# Patient Record
Sex: Male | Born: 1990 | Race: White | Hispanic: No | Marital: Single | Smoking: Former smoker
Health system: Southern US, Community
[De-identification: ages and names within clinical notes are randomized; demographics above are authoritative.]

## PROBLEM LIST (undated history)

## (undated) DIAGNOSIS — F419 Anxiety disorder, unspecified: Secondary | ICD-10-CM

## (undated) DIAGNOSIS — K219 Gastro-esophageal reflux disease without esophagitis: Secondary | ICD-10-CM

## (undated) DIAGNOSIS — F429 Obsessive-compulsive disorder, unspecified: Secondary | ICD-10-CM

## (undated) DIAGNOSIS — F1121 Opioid dependence, in remission: Secondary | ICD-10-CM

## (undated) DIAGNOSIS — F1123 Opioid dependence with withdrawal: Secondary | ICD-10-CM

## (undated) DIAGNOSIS — R0789 Other chest pain: Secondary | ICD-10-CM

## (undated) HISTORY — DX: Other chest pain: R07.89

## (undated) HISTORY — DX: Anxiety disorder, unspecified: F41.9

## (undated) HISTORY — DX: Opioid dependence with withdrawal: F11.23

## (undated) HISTORY — DX: Opioid dependence, in remission: F11.21

## (undated) HISTORY — DX: Gastro-esophageal reflux disease without esophagitis: K21.9

## (undated) HISTORY — DX: Obsessive-compulsive disorder, unspecified: F42.9

---
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
## 2010-08-07 ENCOUNTER — Ambulatory Visit: Payer: Self-pay | Admitting: Nephrology

## 2010-08-24 ENCOUNTER — Encounter: Payer: Self-pay | Admitting: Cardiovascular Disease

## 2011-01-20 ENCOUNTER — Emergency Department: Payer: Self-pay

## 2012-02-07 IMAGING — CR DG CHEST 2V
1 series · 2 of 2 positions shown · non-contrast
Comparison: none

REASON FOR EXAM: wheezing
COMMENTS:

PROCEDURE:     DXR - DXR CHEST PA (OR AP) AND LATERAL  - August 07, 2010  [DATE]
RESULT:     Comparison: None

[Series 1: view not recorded · 0.17mm/px · 2 of 2 slices shown]
[im 1/2]
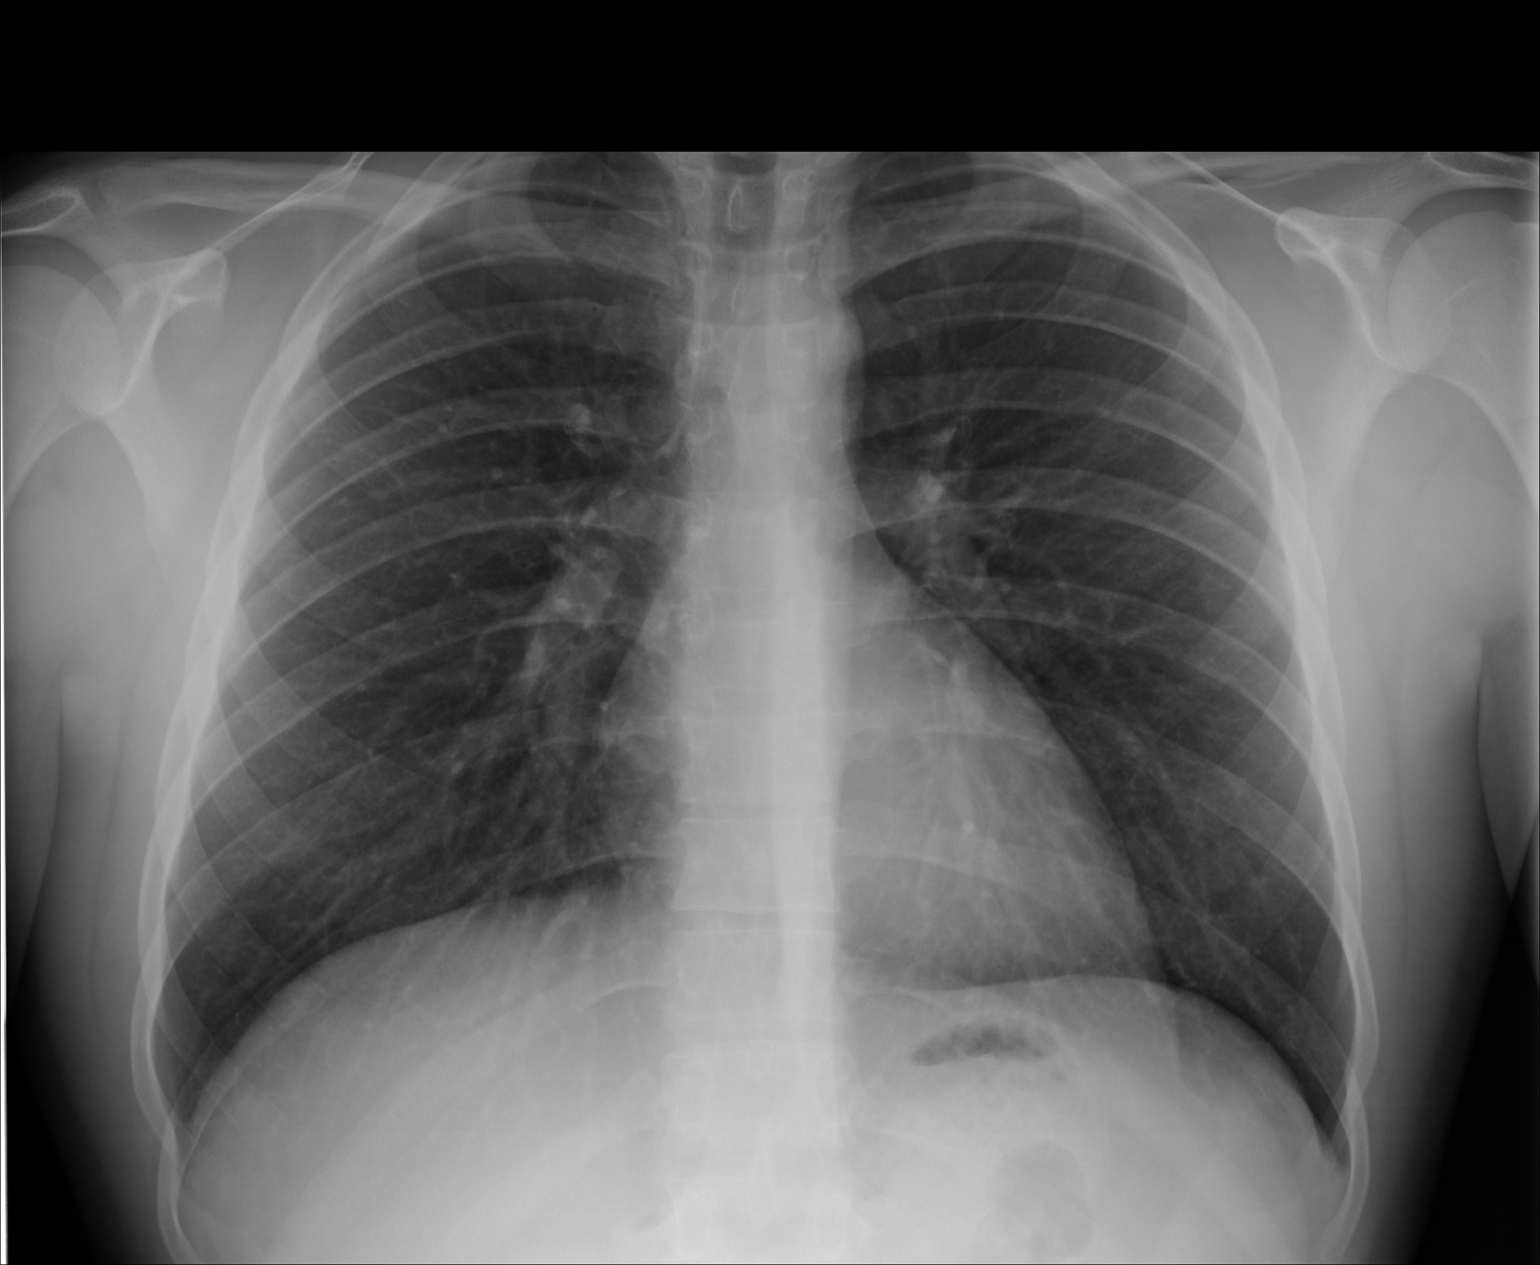
[im 2/2]
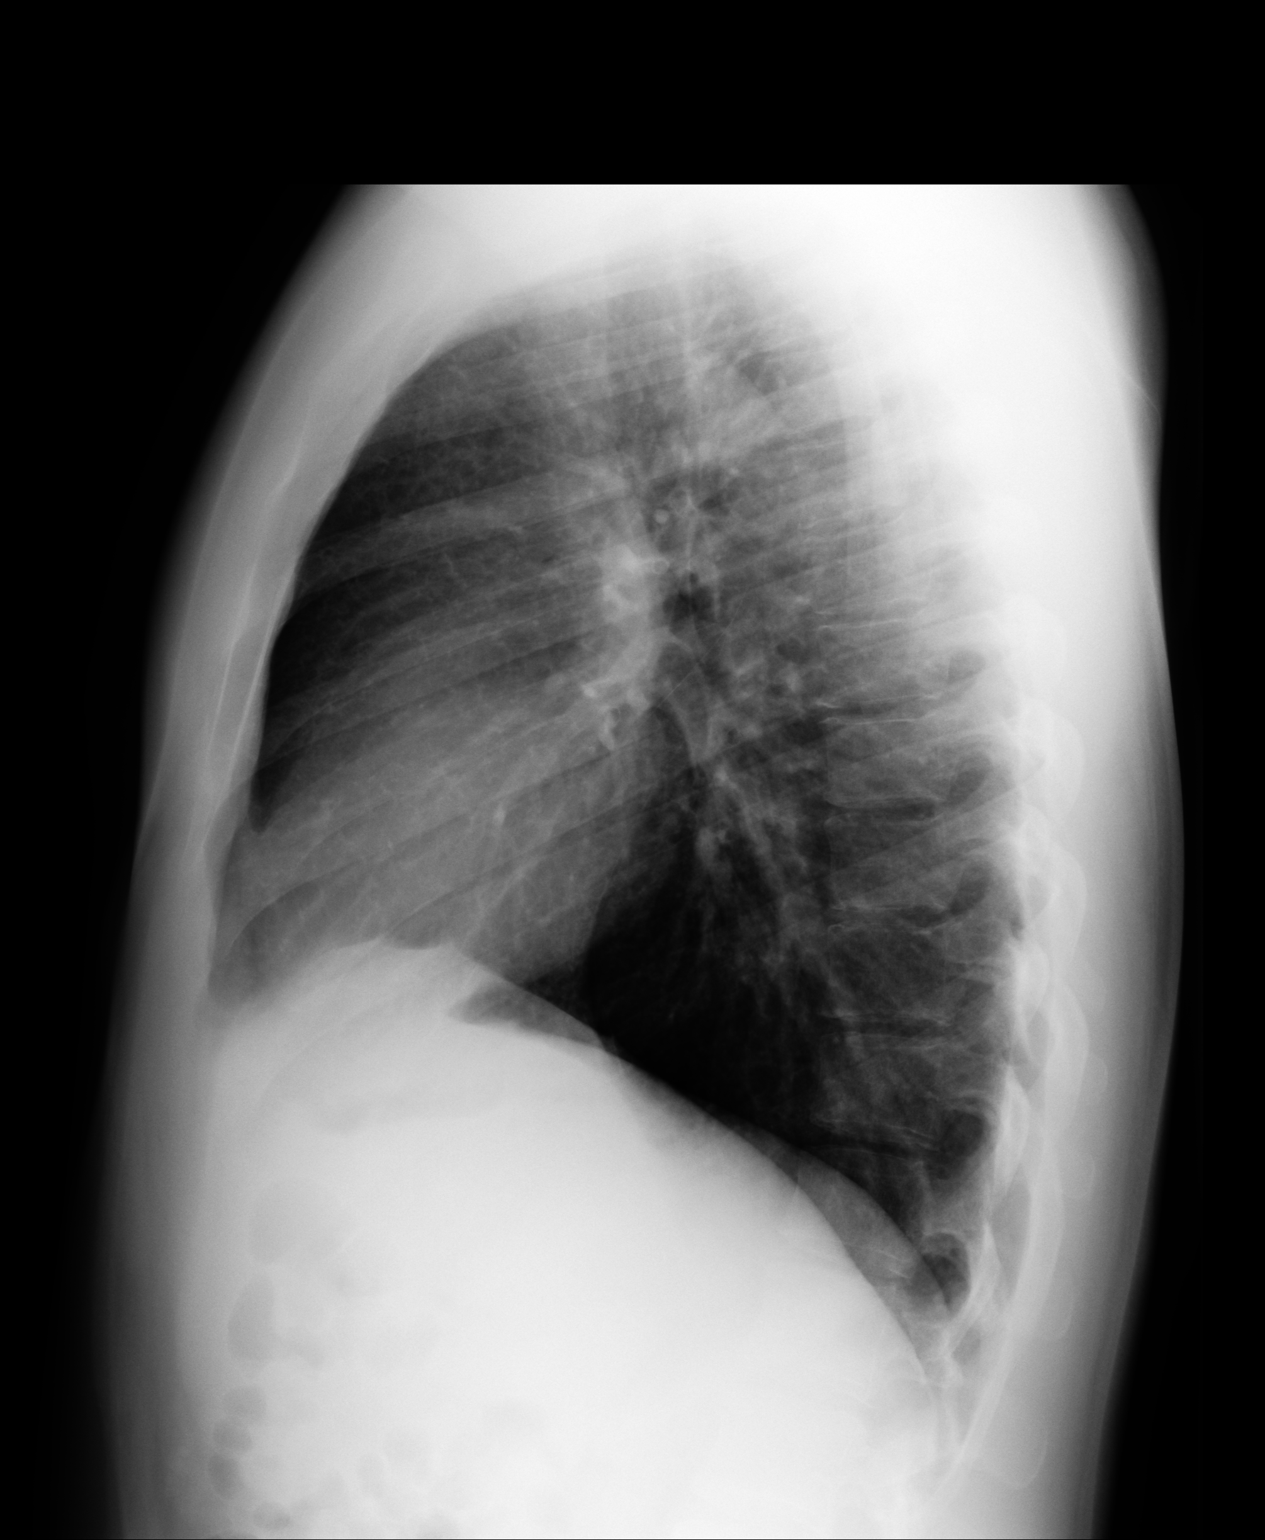

[2 of 2 positions shown; findings below may reference images not displayed]

FINDINGS: PA and lateral chest radiographs are provided.  There is no focal
parenchymal opacity, pleural effusion, or pneumothorax. The heart and
mediastinum are unremarkable.  The osseous structures are unremarkable.
IMPRESSION: No acute disease of the chest.

## 2014-03-05 ENCOUNTER — Ambulatory Visit: Payer: Self-pay | Admitting: Emergency Medicine

## 2014-03-11 ENCOUNTER — Encounter (INDEPENDENT_AMBULATORY_CARE_PROVIDER_SITE_OTHER): Payer: Self-pay

## 2014-03-11 ENCOUNTER — Encounter: Payer: Self-pay | Admitting: Internal Medicine

## 2014-03-11 ENCOUNTER — Ambulatory Visit (INDEPENDENT_AMBULATORY_CARE_PROVIDER_SITE_OTHER): Payer: BLUE CROSS/BLUE SHIELD | Admitting: *Deleted

## 2014-03-11 ENCOUNTER — Ambulatory Visit (INDEPENDENT_AMBULATORY_CARE_PROVIDER_SITE_OTHER): Payer: BLUE CROSS/BLUE SHIELD | Admitting: Internal Medicine

## 2014-03-11 VITALS — BP 117/79 | HR 90 | Temp 98.1°F | Ht 68.75 in | Wt 205.5 lb

## 2014-03-11 DIAGNOSIS — R0602 Shortness of breath: Secondary | ICD-10-CM | POA: Insufficient documentation

## 2014-03-11 DIAGNOSIS — F411 Generalized anxiety disorder: Secondary | ICD-10-CM

## 2014-03-11 DIAGNOSIS — R229 Localized swelling, mass and lump, unspecified: Secondary | ICD-10-CM

## 2014-03-11 DIAGNOSIS — Z0000 Encounter for general adult medical examination without abnormal findings: Secondary | ICD-10-CM

## 2014-03-11 DIAGNOSIS — Z23 Encounter for immunization: Secondary | ICD-10-CM

## 2014-03-11 DIAGNOSIS — R790 Abnormal level of blood mineral: Secondary | ICD-10-CM

## 2014-03-11 LAB — CBC WITH DIFFERENTIAL/PLATELET
Basophils Absolute: 0 10*3/uL (ref 0.0–0.1)
Basophils Relative: 0.4 % (ref 0.0–3.0)
Eosinophils Absolute: 0.5 10*3/uL (ref 0.0–0.7)
Eosinophils Relative: 7.2 % — ABNORMAL HIGH (ref 0.0–5.0)
HCT: 47.1 % (ref 39.0–52.0)
Hemoglobin: 16 g/dL (ref 13.0–17.0)
Lymphocytes Relative: 25 % (ref 12.0–46.0)
Lymphs Abs: 1.7 10*3/uL (ref 0.7–4.0)
MCHC: 34 g/dL (ref 30.0–36.0)
MCV: 85.3 fl (ref 78.0–100.0)
Monocytes Absolute: 0.6 10*3/uL (ref 0.1–1.0)
Monocytes Relative: 8.5 % (ref 3.0–12.0)
Neutro Abs: 4 10*3/uL (ref 1.4–7.7)
Neutrophils Relative %: 58.9 % (ref 43.0–77.0)
Platelets: 208 10*3/uL (ref 150.0–400.0)
RBC: 5.52 Mil/uL (ref 4.22–5.81)
RDW: 12.9 % (ref 11.5–15.5)
WBC: 6.7 10*3/uL (ref 4.0–10.5)

## 2014-03-11 LAB — LIPID PANEL
Cholesterol: 207 mg/dL — ABNORMAL HIGH (ref 0–200)
HDL: 47.3 mg/dL (ref >39.00)
NonHDL: 159.7
Total CHOL/HDL Ratio: 4
Triglycerides: 233 mg/dL — ABNORMAL HIGH (ref 0.0–149.0)
VLDL: 46.6 mg/dL — ABNORMAL HIGH (ref 0.0–40.0)

## 2014-03-11 LAB — COMPREHENSIVE METABOLIC PANEL
ALT: 34 U/L (ref 0–53)
AST: 23 U/L (ref 0–37)
Albumin: 4.7 g/dL (ref 3.5–5.2)
Alkaline Phosphatase: 46 U/L (ref 39–117)
BUN: 14 mg/dL (ref 6–23)
CO2: 27 mEq/L (ref 19–32)
Calcium: 9.7 mg/dL (ref 8.4–10.5)
Chloride: 104 mEq/L (ref 96–112)
Creatinine, Ser: 0.8 mg/dL (ref 0.4–1.5)
GFR: 130.12 mL/min (ref >60.00)
Glucose, Bld: 93 mg/dL (ref 70–99)
Potassium: 4.5 mEq/L (ref 3.5–5.1)
Sodium: 140 mEq/L (ref 135–145)
Total Bilirubin: 0.7 mg/dL (ref 0.2–1.2)
Total Protein: 7.1 g/dL (ref 6.0–8.3)

## 2014-03-11 LAB — TSH: TSH: 1.27 u[IU]/mL (ref 0.35–4.50)

## 2014-03-11 LAB — VITAMIN D 25 HYDROXY (VIT D DEFICIENCY, FRACTURES): VITD: 18.04 ng/mL — ABNORMAL LOW (ref 30.00–100.00)

## 2014-03-11 LAB — LDL CHOLESTEROL, DIRECT: Direct LDL: 127.9 mg/dL

## 2014-03-11 MED ORDER — CLONAZEPAM 0.5 MG PO TABS: 0.5000 mg | ORAL_TABLET | Freq: Two times a day (BID) | ORAL | Status: DC | PRN

## 2014-03-11 MED ORDER — ESCITALOPRAM OXALATE 5 MG PO TABS: 5.0000 mg | ORAL_TABLET | Freq: Every day | ORAL | Status: DC

## 2014-03-11 NOTE — Assessment & Plan Note (Signed)
Likely related to panic attacks during anxiety. EKG normal today. Will treat anxiety as noted. Follow up 4 weeks.

## 2014-03-11 NOTE — Patient Instructions (Signed)
Start Lexapro  daily.  Take Clonazepam 0.5mg  up to twice daily for severe anxiety. Note that Clonazepam can cause drowsiness.  Labs today.  Follow up in 4 weeks.

## 2014-03-11 NOTE — Progress Notes (Signed)
Subjective:    Patient ID: Matthew Curry, male    DOB: 1990-07-06, 24 y.o.   MRN: [id number]  HPI 24YO male presents to establish care.  Anxiety - Went to urgent care 1 week ago for anxiety. Given Atarax, but never took this. At times feels chest tightness and shortness of breath with anxiety.  Work makes anxiety worse. Feels anxious most of the time.  Describes self as "worrier." As child, had to wash hands repeatedly. Now, sometimes repeatedly checks locks. Has never been on meds for this.  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 Has tapered to 1/16th of a patch daily. Plans to stop.   Past medical, surgical, family and social history per today's encounter.  Review of Systems  Constitutional: Negative for fever, chills, activity change, appetite change, fatigue and unexpected weight change.  Eyes: Negative for visual disturbance.  Respiratory: Negative for cough and shortness of breath.   Cardiovascular: Negative for chest pain, palpitations and leg swelling.  Gastrointestinal: Negative for nausea, vomiting, abdominal pain, diarrhea, constipation and abdominal distention.  Genitourinary: Negative for dysuria, urgency and difficulty urinating.  Musculoskeletal: Negative for myalgias, arthralgias and gait problem.  Skin: Negative for color change and rash.  Hematological: Negative for adenopathy.  Psychiatric/Behavioral: Negative for suicidal ideas, sleep disturbance and dysphoric mood. The patient is not nervous/anxious.        Objective:    BP 117/79 mmHg  Pulse 90  Temp(Src) 98.1 F (36.7 C) (Oral)  Ht 5' 8.75" (1.746 m)  Wt 205 lb 8 oz (93.214 kg)  BMI 30.58 kg/m2  SpO2 97% Physical Exam  Constitutional: He is oriented to person, place, and time. He appears well-developed and well-nourished. No distress.  HENT:  Head: Normocephalic and atraumatic.  Right Ear: External ear normal.  Left Ear: External ear  normal.  Nose: Nose normal.  Mouth/Throat: Oropharynx is clear and moist. No oropharyngeal exudate.  Eyes: Conjunctivae and EOM are normal. Pupils are equal, round, and reactive to light. Right eye exhibits no discharge. Left eye exhibits no discharge. No scleral icterus.  Neck: Normal range of motion. Neck supple. No tracheal deviation present. No thyromegaly present.  Cardiovascular: Normal rate, regular rhythm and normal heart sounds.  Exam reveals no gallop and no friction rub.   No murmur heard. Pulmonary/Chest: Effort normal and breath sounds normal. No accessory muscle usage. No tachypnea. No respiratory distress. He has no decreased breath sounds. He has no wheezes. He has no rhonchi. He has no rales. He exhibits no tenderness.  Abdominal: Soft. Bowel sounds are normal. He exhibits no distension and no mass. There is no tenderness. There is no rebound and no guarding.  Musculoskeletal: Normal range of motion. He exhibits no edema.  Lymphadenopathy:    He has no cervical adenopathy.  Neurological: He is alert and oriented to person, place, and time. No cranial nerve deficit. Coordination normal.  Skin: Skin is warm and dry. No rash noted. He is not diaphoretic. No erythema. No pallor.     Psychiatric: He has a normal mood and affect. His behavior is normal. Judgment and thought content normal.          Assessment & Plan:   Problem List Items Addressed This Visit      Unprioritized   Generalized anxiety disorder - Primary    Symptoms most consistent with GAD. Will start Lexapro and prn Clonazepam. Discussed referral to psychiatry, but he prefers to hold off  on this. Follow up in 4 weeks and prn.    Relevant Medications      escitalopram (LEXAPRO) tablet      clonazePAM (KLONOPIN)  tablet   Other Relevant Orders      TSH   Shortness of breath    Likely related to panic attacks during anxiety. EKG normal today. Will treat anxiety as noted. Follow up 4 weeks.    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 Given this has been unchanged for >2 years, likely benign. Discussed option of monitoring for now versus referral for resection. Will monitor.     Other Visit Diagnoses    Routine general medical examination at a health care facility        Relevant Orders       CBC with Differential       Comprehensive metabolic panel       Lipid panel       Vit D  25 hydroxy (rtn osteoporosis monitoring)        Return in about 4 weeks (around 04/08/2014) for Recheck.

## 2014-03-11 NOTE — Assessment & Plan Note (Signed)
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 Given this has been unchanged for >2 years, likely benign. Discussed option of monitoring for now versus referral for resection. Will monitor.

## 2014-03-11 NOTE — Assessment & Plan Note (Signed)
Symptoms most consistent with GAD. Will start Lexapro and prn Clonazepam. Discussed referral to psychiatry, but he prefers to hold off on this. Follow up in 4 weeks and prn.

## 2014-04-09 ENCOUNTER — Ambulatory Visit: Payer: BLUE CROSS/BLUE SHIELD | Admitting: Internal Medicine

## 2014-05-01 ENCOUNTER — Ambulatory Visit (INDEPENDENT_AMBULATORY_CARE_PROVIDER_SITE_OTHER): Payer: BLUE CROSS/BLUE SHIELD | Admitting: Internal Medicine

## 2014-05-01 ENCOUNTER — Encounter: Payer: Self-pay | Admitting: Internal Medicine

## 2014-05-01 VITALS — BP 135/77 | HR 70 | Temp 98.0°F | Ht 68.75 in | Wt 210.0 lb

## 2014-05-01 DIAGNOSIS — F411 Generalized anxiety disorder: Secondary | ICD-10-CM

## 2014-05-01 NOTE — Assessment & Plan Note (Signed)
Symptoms of GAD unchanged. Discussed risks and benefits of Lexapro. Will restart medication. Continue prn Clonazepam. Encouraged relaxation techniques, exercise. Follow up by email in 2 weeks and in visit in 4 weeks.

## 2014-05-01 NOTE — Progress Notes (Signed)
Pre visit review using our clinic review tool, if applicable. No additional management support is needed unless otherwise documented below in the visit note. 

## 2014-05-01 NOTE — Patient Instructions (Signed)
Email with update in 2-3 weeks.

## 2014-05-01 NOTE — Progress Notes (Signed)
   Subjective:    Patient ID: Matthew Curry, male    DOB: 1990/11/14, 24 y.o.   MRN: [id number]  HPI 24YO male presents for follow up.  Last seen 1/4 for GAD. Started on Lexapro and prn Clonazepam. Took Lexapro for about 1 week, then stopped. Worried about side effects. Used Clonazepam about 10 times last month. Continues to have daily anxiety. Trying to use relaxation techniques and increase exercise. Notes that work is overwhelming at times.   Past medical, surgical, family and social history per today's encounter.  Review of Systems  Constitutional: Negative for fever, chills, activity change, appetite change, fatigue and unexpected weight change.  Eyes: Negative for visual disturbance.  Respiratory: Negative for cough and shortness of breath.   Cardiovascular: Negative for chest pain, palpitations and leg swelling.  Gastrointestinal: Negative for abdominal pain and abdominal distention.  Genitourinary: Negative for dysuria, urgency and difficulty urinating.  Musculoskeletal: Negative for arthralgias and gait problem.  Skin: Negative for color change and rash.  Hematological: Negative for adenopathy.  Psychiatric/Behavioral: Negative for sleep disturbance and dysphoric mood. The patient is nervous/anxious.        Objective:    BP 135/77 mmHg  Pulse 70  Temp(Src) 98 F (36.7 C) (Oral)  Ht 5' 8.75" (1.746 m)  Wt 210 lb (95.255 kg)  BMI 31.25 kg/m2  SpO2 98% Physical Exam  Constitutional: He is oriented to person, place, and time. He appears well-developed and well-nourished. No distress.  HENT:  Head: Normocephalic and atraumatic.  Right Ear: External ear normal.  Left Ear: External ear normal.  Nose: Nose normal.  Mouth/Throat: Oropharynx is clear and moist. No oropharyngeal exudate.  Eyes: Conjunctivae and EOM are normal. Pupils are equal, round, and reactive to light. Right eye exhibits no discharge. Left eye exhibits no discharge. No scleral icterus.    Neck: Normal range of motion. Neck supple. No tracheal deviation present. No thyromegaly present.  Cardiovascular: Normal rate, regular rhythm and normal heart sounds.  Exam reveals no gallop and no friction rub.   No murmur heard. Pulmonary/Chest: Effort normal and breath sounds normal. No accessory muscle usage. No tachypnea. No respiratory distress. He has no decreased breath sounds. He has no wheezes. He has no rhonchi. He has no rales. He exhibits no tenderness.  Musculoskeletal: Normal range of motion. He exhibits no edema.  Lymphadenopathy:    He has no cervical adenopathy.  Neurological: He is alert and oriented to person, place, and time. No cranial nerve deficit. Coordination normal.  Skin: Skin is warm and dry. No rash noted. He is not diaphoretic. No erythema. No pallor.  Psychiatric: He has a normal mood and affect. His behavior is normal. Judgment and thought content normal.          Assessment & Plan:   Problem List Items Addressed This Visit      Unprioritized   Generalized anxiety disorder - Primary    Symptoms of GAD unchanged. Discussed risks and benefits of Lexapro. Will restart medication. Continue prn Clonazepam. Encouraged relaxation techniques, exercise. Follow up by email in 2 weeks and in visit in 4 weeks.          Return in about 4 weeks (around 05/29/2014) for Recheck.

## 2014-05-15 ENCOUNTER — Encounter: Payer: Self-pay | Admitting: *Deleted

## 2014-05-30 ENCOUNTER — Ambulatory Visit: Payer: BLUE CROSS/BLUE SHIELD | Admitting: Internal Medicine

## 2014-06-03 ENCOUNTER — Ambulatory Visit: Payer: BLUE CROSS/BLUE SHIELD | Admitting: Internal Medicine

## 2014-06-21 ENCOUNTER — Ambulatory Visit
Admit: 2014-06-21 | Disposition: A | Discharge status: Home / Self Care | Payer: Self-pay | Attending: Family Medicine | Admitting: Family Medicine

## 2014-06-21 LAB — URINALYSIS, COMPLETE
Bacteria: NEGATIVE
Bilirubin,UR: NEGATIVE
Blood: NEGATIVE
Glucose,UR: NEGATIVE
Ketone: NEGATIVE
Leukocyte Esterase: NEGATIVE
NITRITE: NEGATIVE
PH: 6.5 (ref 5.0–8.0)
PROTEIN: NEGATIVE
Specific Gravity: 1.015 (ref 1.000–1.030)

## 2014-06-23 LAB — URINE CULTURE

## 2014-07-12 ENCOUNTER — Other Ambulatory Visit: Payer: Self-pay | Admitting: Internal Medicine

## 2014-08-12 ENCOUNTER — Ambulatory Visit: Payer: BLUE CROSS/BLUE SHIELD | Admitting: Nurse Practitioner

## 2014-08-12 ENCOUNTER — Ambulatory Visit (INDEPENDENT_AMBULATORY_CARE_PROVIDER_SITE_OTHER): Payer: BLUE CROSS/BLUE SHIELD | Admitting: Internal Medicine

## 2014-08-12 ENCOUNTER — Encounter: Payer: Self-pay | Admitting: Internal Medicine

## 2014-08-12 VITALS — BP 122/62 | HR 61 | Temp 97.9°F | Ht 68.75 in | Wt 183.4 lb

## 2014-08-12 DIAGNOSIS — K121 Other forms of stomatitis: Secondary | ICD-10-CM | POA: Insufficient documentation

## 2014-08-12 NOTE — Assessment & Plan Note (Signed)
Exam is most consistent with apthous ulcer, however given his history of tobacco abuse, and persistence of ulcer, will set up ENT evaluation. Question if biopsy might be helpful.

## 2014-08-12 NOTE — Progress Notes (Signed)
   Subjective:    Patient ID: Matthew Curry, male    DOB: March 29, 1990, 24 y.o.   MRN: [id number]  HPI  24YO male presents for acute visit.  Sore in left posterior throat. Came up about a year ago. Took about 3 days of PCN and it resolved. Recurred Memorial Day weekend. Started using salt water rinses and symptoms improved. Then, last Thursday, it had recurred. Previous history of tobacco use (dip).   Past medical, surgical, family and social history per today's encounter.  Review of Systems  Constitutional: Negative for fever, chills, activity change, appetite change, fatigue and unexpected weight change.  HENT: Positive for sore throat. Negative for trouble swallowing and voice change.   Eyes: Negative for visual disturbance.  Respiratory: Negative for cough and shortness of breath.   Cardiovascular: Negative for chest pain, palpitations and leg swelling.  Gastrointestinal: Negative for abdominal pain and abdominal distention.  Genitourinary: Negative for dysuria, urgency and difficulty urinating.  Musculoskeletal: Negative for arthralgias and gait problem.  Skin: Negative for color change and rash.  Hematological: Negative for adenopathy.  Psychiatric/Behavioral: Negative for sleep disturbance and dysphoric mood. The patient is not nervous/anxious.        Objective:    BP 122/62 mmHg  Pulse 61  Temp(Src) 97.9 F (36.6 C) (Oral)  Ht 5' 8.75" (1.746 m)  Wt 183 lb 6 oz (83.178 kg)  BMI 27.28 kg/m2  SpO2 100% Physical Exam  Constitutional: He is oriented to person, place, and time. He appears well-developed and well-nourished. No distress.  HENT:  Head: Normocephalic and atraumatic.  Right Ear: External ear normal.  Left Ear: External ear normal.  Nose: Nose normal.  Mouth/Throat: Oropharynx is clear and moist. Oral lesions present.    Eyes: Conjunctivae and EOM are normal. Pupils are equal, round, and reactive to light. Right eye exhibits no discharge. Left eye  exhibits no discharge. No scleral icterus.  Neck: Normal range of motion. Neck supple. No tracheal deviation present. No thyromegaly present.  Pulmonary/Chest: Effort normal. No accessory muscle usage. No tachypnea. He has no decreased breath sounds. He has no rhonchi.  Musculoskeletal: Normal range of motion. He exhibits no edema.  Lymphadenopathy:    He has no cervical adenopathy.  Neurological: He is alert and oriented to person, place, and time. No cranial nerve deficit. Coordination normal.  Skin: Skin is warm and dry. No rash noted. He is not diaphoretic. No erythema. No pallor.  Psychiatric: He has a normal mood and affect. His behavior is normal. Judgment and thought content normal.          Assessment & Plan:   Problem List Items Addressed This Visit      Unprioritized   Mouth ulcer - Primary    Exam is most consistent with apthous ulcer, however given his history of tobacco abuse, and persistence of ulcer, will set up ENT evaluation. Question if biopsy might be helpful.      Relevant Orders   Ambulatory referral to ENT       Return if symptoms worsen or fail to improve.

## 2014-08-12 NOTE — Progress Notes (Signed)
Pre visit review using our clinic review tool, if applicable. No additional management support is needed unless otherwise documented below in the visit note. 

## 2014-08-12 NOTE — Patient Instructions (Signed)
Oral Ulcers Oral ulcers are painful, shallow sores around the lining of the mouth. They can affect the gums, the inside of the lips, and the cheeks. (Sores on the outside of the lips and on the face are different.) They typically first occur in school-aged children and teenagers. Oral ulcers may also be called canker sores or cold sores. CAUSES  Canker sores and cold sores can be caused by many factors including:  Infection.  Injury.  Sun exposure.  Medications.  Emotional stress.  Food allergies.  Vitamin deficiencies.  Toothpastes containing sodium lauryl sulfate. The herpes virus can be the cause of mouth ulcers. The first infection can be severe and cause 10 or more ulcers on the gums, tongue, and lips with fever and difficulty in swallowing. This infection usually occurs between the ages of 1 and 3 years.  SYMPTOMS  The typical sore is about  inch (6 mm) in size and is an oval or round ulcer with red borders. DIAGNOSIS  Your caregiver can diagnose simple oral ulcers by examination. Additional testing is usually not required.  TREATMENT  Treatment is aimed at pain relief. Generally, oral ulcers resolve by themselves within 1 to 2 weeks without medication and are not contagious unless caused by herpes (and other viruses). Antibiotics are not effective with mouth sores. Avoid direct contact with others until the ulcer is completely healed. See your caregiver for follow-up care as recommended. Also:  Offer a soft diet.  Encourage plenty of fluids to prevent dehydration. Popsicles and milk shakes can be helpful.  Avoid acidic and salty foods and drinks such as orange juice.  Infants and young children will often refuse to drink because of pain. Using a teaspoon, cup, or syringe to give small amounts of fluids frequently can help prevent dehydration.  Cold compresses on the face may help reduce pain.  Pain medication can help control soreness.  A solution of diphenhydramine  mixed with a liquid antacid can be useful to decrease the soreness of ulcers. Consult a caregiver for the dosing.  Liquids or ointments with a numbing ingredient may be helpful when used as recommended.  Older children and teenagers can rinse their mouth with a salt-water mixture (1/2 teaspoon of salt in 8 ounces of water) four times a day. This treatment is uncomfortable but may reduce the time the ulcers are present.  There are many over-the-counter throat lozenges and medications available for oral ulcers. Their effectiveness has not been studied.  Consult your medical caregiver prior to using homeopathic treatments for oral ulcers. SEEK MEDICAL CARE IF:   You think your child needs to be seen.  The pain worsens and you cannot control it.  There are 4 or more ulcers.  The lips and gums begin to bleed and crust.  A single mouth ulcer is near a tooth that is causing a toothache or pain.  Your child has a fever, swollen face, or swollen glands.  The ulcers began after starting a medication.  Mouth ulcers keep reoccurring or last more than 2 weeks.  You think your child is not taking adequate fluids. SEEK IMMEDIATE MEDICAL CARE IF:   Your child has a high fever.  Your child is unable to swallow or becomes dehydrated.  Your child looks or acts very ill.  An ulcer caused by a chemical your child accidentally put in their mouth. Document Released: 04/01/2004 Document Revised: 07/09/2013 Document Reviewed: 11/14/2008 Exit[employer] Patient Information 2015 [employer]. This information is not intended to replace advice   given to you by your health care provider. Make sure you discuss any questions you have with your health care provider.  

## 2014-08-13 ENCOUNTER — Ambulatory Visit: Payer: BLUE CROSS/BLUE SHIELD | Admitting: Nurse Practitioner

## 2014-09-08 ENCOUNTER — Other Ambulatory Visit: Payer: Self-pay | Admitting: Internal Medicine

## 2014-09-10 NOTE — Telephone Encounter (Signed)
Last OV 6.6.66m, last refill 5.6.16.  Please advise in Dr Sonny DandyWalkers absence

## 2014-09-10 NOTE — Telephone Encounter (Signed)
Please call pt and confirm how she is taking the medication.  Per Dr Tilman NeatWalker's last note - only taking prn.  She was given #60 with 3 refills in January.  Just need to know how often taking.

## 2014-09-12 NOTE — Telephone Encounter (Signed)
Left message on VM to return call 

## 2014-10-15 ENCOUNTER — Other Ambulatory Visit: Payer: Self-pay | Admitting: Internal Medicine

## 2014-10-15 NOTE — Telephone Encounter (Signed)
Last OV 6.6.16.  Please advise refill 

## 2014-10-15 NOTE — Telephone Encounter (Signed)
rx faxed

## 2014-12-20 ENCOUNTER — Encounter: Payer: Self-pay | Admitting: Internal Medicine

## 2014-12-20 ENCOUNTER — Ambulatory Visit (INDEPENDENT_AMBULATORY_CARE_PROVIDER_SITE_OTHER): Payer: BLUE CROSS/BLUE SHIELD | Admitting: Internal Medicine

## 2014-12-20 VITALS — BP 106/55 | HR 68 | Temp 97.9°F | Ht 68.5 in | Wt 177.0 lb

## 2014-12-20 DIAGNOSIS — Z0000 Encounter for general adult medical examination without abnormal findings: Secondary | ICD-10-CM | POA: Diagnosis not present

## 2014-12-20 DIAGNOSIS — F1193 Opioid use, unspecified with withdrawal: Secondary | ICD-10-CM

## 2014-12-20 DIAGNOSIS — F411 Generalized anxiety disorder: Secondary | ICD-10-CM

## 2014-12-20 DIAGNOSIS — F1123 Opioid dependence with withdrawal: Secondary | ICD-10-CM | POA: Diagnosis not present

## 2014-12-20 DIAGNOSIS — Z23 Encounter for immunization: Secondary | ICD-10-CM | POA: Diagnosis not present

## 2014-12-20 DIAGNOSIS — Z0001 Encounter for general adult medical examination with abnormal findings: Secondary | ICD-10-CM | POA: Insufficient documentation

## 2014-12-20 HISTORY — DX: Opioid dependence with withdrawal: F11.23

## 2014-12-20 HISTORY — DX: Opioid use, unspecified with withdrawal: F11.93

## 2014-12-20 MED ORDER — CLONIDINE HCL 0.1 MG PO TABS: 0.1000 mg | ORAL_TABLET | Freq: Every day | ORAL | Status: DC

## 2014-12-20 MED ORDER — CLONAZEPAM 0.5 MG PO TABS: ORAL_TABLET | ORAL | Status: DC

## 2014-12-20 NOTE — Progress Notes (Signed)
Subjective:    Patient ID: Matthew Curry, male    DOB: 05-08-1990, 24 y.o.   MRN: [id number]  HPI  24YO male presents for annual exam.  Generally feeling well. Seeing a counselor to help with anxiety. Continues on Suboxone. Has weaned to 1mg  of strip daily. When tries to stop, develops more anxiety and feels agitated. Would like to get off the medication.   Wt Readings from Last 3 Encounters:  12/20/14 177 lb (80.287 kg)  08/12/14 183 lb 6 oz (83.178 kg)  05/01/14 210 lb (95.255 kg)   BP Readings from Last 3 Encounters:  12/20/14 106/55  08/12/14 122/62  05/01/14 135/77    Past Medical History  Diagnosis Date  . Chest tightness or pressure   . GERD (gastroesophageal reflux disease)   . OCD (obsessive compulsive disorder)   . Anxiety   . History of narcotic addiction (HCC)     Previously on Suboxone, [place] in [place]   Family History  Problem Relation Age of Onset  . Coronary artery disease Father     several of Pt family members(not mother or sibilings) have "leak valves"  . Diabetes Father    Past Surgical History  Procedure Laterality Date  . Adenoidectomy  1998   Social History   Social History  . Marital Status: Single    Spouse Name: N/A  . Number of Children: N/A  . Years of Education: N/A   Social History Main Topics  . Smoking status: Former Games developermoker  . Smokeless tobacco: None     Comment: quit 1 year ago  . Alcohol Use: Yes     Comment: few beers/day  . Drug Use: None  . Sexual Activity: Not Asked   Other Topics Concern  . None   Social History Narrative   [place]. No children. Has 2 dogs in home.      Work - [employer]      Diet - regular      Exercise - Runs about 1x per week    Review of Systems  Constitutional: Negative for fever, chills, activity change, appetite change, fatigue and unexpected weight change.  Eyes: Negative for visual disturbance.  Respiratory: Negative  for cough and shortness of breath.   Cardiovascular: Negative for chest pain, palpitations and leg swelling.  Gastrointestinal: Negative for nausea, vomiting, abdominal pain, diarrhea, constipation and abdominal distention.  Genitourinary: Negative for dysuria, urgency and difficulty urinating.  Musculoskeletal: Negative for arthralgias and gait problem.  Skin: Negative for color change and rash.  Hematological: Negative for adenopathy.  Psychiatric/Behavioral: Negative for sleep disturbance and dysphoric mood. The patient is nervous/anxious.        Objective:    BP 106/55 mmHg  Pulse 68  Temp(Src) 97.9 F (36.6 C) (Oral)  Ht 5' 8.5" (1.74 m)  Wt 177 lb (80.287 kg)  BMI 26.52 kg/m2  SpO2 98% Physical Exam  Constitutional: He is oriented to person, place, and time. He appears well-developed and well-nourished. No distress.  HENT:  Head: Normocephalic and atraumatic.  Right Ear: External ear normal.  Left Ear: External ear normal.  Nose: Nose normal.  Mouth/Throat: Oropharynx is clear and moist. No oropharyngeal exudate.  Eyes: Conjunctivae and EOM are normal. Pupils are equal, round, and reactive to light. Right eye exhibits no discharge. Left eye exhibits no discharge. No scleral icterus.  Neck: Normal range of motion. Neck supple. No tracheal deviation present. No thyromegaly present.  Cardiovascular: Normal rate,  regular rhythm and normal heart sounds.  Exam reveals no gallop and no friction rub.   No murmur heard. Pulmonary/Chest: Effort normal and breath sounds normal. No accessory muscle usage. No tachypnea. No respiratory distress. He has no decreased breath sounds. He has no wheezes. He has no rhonchi. He has no rales. He exhibits no tenderness.  Musculoskeletal: Normal range of motion. He exhibits no edema.  Lymphadenopathy:    He has no cervical adenopathy.  Neurological: He is alert and oriented to person, place, and time. No cranial nerve deficit. Coordination normal.    Skin: Skin is warm and dry. No rash noted. He is not diaphoretic. No erythema. No pallor.  Psychiatric: He has a normal mood and affect. His behavior is normal. Judgment and thought content normal.          Assessment & Plan:   Problem List Items Addressed This Visit      Unprioritized   Generalized anxiety disorder    Symptoms worsened by Suboxone withdrawal. Will continue prn Clonazepam. Follow up 4 weeks and prn.      Narcotic withdrawal (HCC)    Will add Clonidine at bedtime to help with withdrawal symptoms. Plan to stop Suboxone . Follow up in 4 weeks and by email.      Routine general medical examination at a health care facility - Primary    General medical exam normal today. Encouraged continued healthy diet and exercise. Reviewed labs from 03/2014. Flu vaccine today. Follow up for physical in 1 year.          Return in about 4 weeks (around 01/17/2015) for Recheck.

## 2014-12-20 NOTE — Patient Instructions (Addendum)
Start Clonidine 0.1mg  at bedtime.  Health Maintenance, Male A healthy lifestyle and preventative care can promote health and wellness.  Maintain regular health, dental, and eye exams.  Eat a healthy diet. Foods like vegetables, fruits, whole grains, low-fat dairy products, and lean protein foods contain the nutrients you need and are low in calories. Decrease your intake of foods high in solid fats, added sugars, and salt. Get information about a proper diet from your health care provider, if necessary.  Regular physical exercise is one of the most important things you can do for your health. Most adults should get at least 150 minutes of moderate-intensity exercise (any activity that increases your heart rate and causes you to sweat) each week. In addition, most adults need muscle-strengthening exercises on 2 or more days a week.   Maintain a healthy weight. The body mass index (BMI) is a screening tool to identify possible weight problems. It provides an estimate of body fat based on height and weight. Your health care provider can find your BMI and can help you achieve or maintain a healthy weight. For males 20 years and older:  A BMI below 18.5 is considered underweight.  A BMI of 18.5 to 24.9 is normal.  A BMI of 25 to 29.9 is considered overweight.  A BMI of 30 and above is considered obese.  Maintain normal blood lipids and cholesterol by exercising and minimizing your intake of saturated fat. Eat a balanced diet with plenty of fruits and vegetables. Blood tests for lipids and cholesterol should begin at age 320 and be repeated every 5 years. If your lipid or cholesterol levels are high, you are over age 24, or you are at high risk for heart disease, you may need your cholesterol levels checked more frequently.Ongoing high lipid and cholesterol levels should be treated with medicines if diet and exercise are not working.  If you smoke, find out from your health care provider how to  quit. If you do not use tobacco, do not start.  Lung cancer screening is recommended for adults aged 55-80 years who are at high risk for developing lung cancer because of a history of smoking. A yearly low-dose CT scan of the lungs is recommended for people who have at least a 30-pack-year history of smoking and are current smokers or have quit within the past 15 years. A pack year of smoking is smoking an average of 1 pack of cigarettes a day for 1 year (for example, a 30-pack-year history of smoking could mean smoking 1 pack a day for 30 years or 2 packs a day for 15 years). Yearly screening should continue until the smoker has stopped smoking for at least 15 years. Yearly screening should be stopped for people who develop a health problem that would prevent them from having lung cancer treatment.  If you choose to drink alcohol, do not have more than 2 drinks per day. One drink is considered to be 12 oz (360 mL) of beer, 5 oz (150 mL) of wine, or 1.5 oz (45 mL) of liquor.  Avoid the use of street drugs. Do not share needles with anyone. Ask for help if you need support or instructions about stopping the use of drugs.  High blood pressure causes heart disease and increases the risk of stroke. High blood pressure is more likely to develop in:  People who have blood pressure in the end of the normal range (100-139/85-89 mm Hg).  People who are overweight or obese.  People who are African American.  If you are 94-79 years of age, have your blood pressure checked every 3-5 years. If you are 52 years of age or older, have your blood pressure checked every year. You should have your blood pressure measured twice--once when you are at a hospital or clinic, and once when you are not at a hospital or clinic. Record the average of the two measurements. To check your blood pressure when you are not at a hospital or clinic, you can use:  An automated blood pressure machine at a pharmacy.  A home blood  pressure monitor.  If you are 31-94 years old, ask your health care provider if you should take aspirin to prevent heart disease.  Diabetes screening involves taking a blood sample to check your fasting blood sugar level. This should be done once every 3 years after age 96 if you are at a normal weight and without risk factors for diabetes. Testing should be considered at a younger age or be carried out more frequently if you are overweight and have at least 1 risk factor for diabetes.  Colorectal cancer can be detected and often prevented. Most routine colorectal cancer screening begins at the age of 17 and continues through age 73. However, your health care provider may recommend screening at an earlier age if you have risk factors for colon cancer. On a yearly basis, your health care provider may provide home test kits to check for hidden blood in the stool. A small camera at the end of a tube may be used to directly examine the colon (sigmoidoscopy or colonoscopy) to detect the earliest forms of colorectal cancer. Talk to your health care provider about this at age 40 when routine screening begins. A direct exam of the colon should be repeated every 5-10 years through age 73, unless early forms of precancerous polyps or small growths are found.  People who are at an increased risk for hepatitis B should be screened for this virus. You are considered at high risk for hepatitis B if:  You were born in a country where hepatitis B occurs often. Talk with your health care provider about which countries are considered high risk.  Your parents were born in a high-risk country and you have not received a shot to protect against hepatitis B (hepatitis B vaccine).  You have HIV or AIDS.  You use needles to inject street drugs.  You live with, or have sex with, someone who has hepatitis B.  You are a man who has sex with other men (MSM).  You get hemodialysis treatment.  You take certain medicines  for conditions like cancer, organ transplantation, and autoimmune conditions.  Hepatitis C blood testing is recommended for all people born from [birth year] through 1965 and any individual with known risk factors for hepatitis C.  Healthy men should no longer receive prostate-specific antigen (PSA) blood tests as part of routine cancer screening. Talk to your health care provider about prostate cancer screening.  Testicular cancer screening is not recommended for adolescents or adult males who have no symptoms. Screening includes self-exam, a health care provider exam, and other screening tests. Consult with your health care provider about any symptoms you have or any concerns you have about testicular cancer.  Practice safe sex. Use condoms and avoid high-risk sexual practices to reduce the spread of sexually transmitted infections (STIs).  You should be screened for STIs, including gonorrhea and chlamydia if:  You are sexually active and are  younger than 24 years.  You are older than 24 years, and your health care provider tells you that you are at risk for this type of infection.  Your sexual activity has changed since you were last screened, and you are at an increased risk for chlamydia or gonorrhea. Ask your health care provider if you are at risk.  If you are at risk of being infected with HIV, it is recommended that you take a prescription medicine daily to prevent HIV infection. This is called pre-exposure prophylaxis (PrEP). You are considered at risk if:  You are a man who has sex with other men (MSM).  You are a heterosexual man who is sexually active with multiple partners.  You take drugs by injection.  You are sexually active with a partner who has HIV.  Talk with your health care provider about whether you are at high risk of being infected with HIV. If you choose to begin PrEP, you should first be tested for HIV. You should then be tested every 3 months for as long as you are  taking PrEP.  Use sunscreen. Apply sunscreen liberally and repeatedly throughout the day. You should seek shade when your shadow is shorter than you. Protect yourself by wearing long sleeves, pants, a wide-brimmed hat, and sunglasses year round whenever you are outdoors.  Tell your health care provider of new moles or changes in moles, especially if there is a change in shape or color. Also, tell your health care provider if a mole is larger than the size of a pencil eraser.  A one-time screening for abdominal aortic aneurysm (AAA) and surgical repair of large AAAs by ultrasound is recommended for men aged 7-75 years who are current or former smokers.  Stay current with your vaccines (immunizations).   This information is not intended to replace advice given to you by your health care provider. Make sure you discuss any questions you have with your health care provider.   Document Released: 08/21/2007 Document Revised: 03/15/2014 Document Reviewed: 07/20/2010 Elsevier Interactive Patient Education [employer].

## 2014-12-20 NOTE — Progress Notes (Signed)
Pre visit review using our clinic review tool, if applicable. No additional management support is needed unless otherwise documented below in the visit note. 

## 2014-12-20 NOTE — Assessment & Plan Note (Signed)
Will add Clonidine at bedtime to help with withdrawal symptoms. Plan to stop Suboxone 1mg . Follow up in 4 weeks and by email.

## 2014-12-20 NOTE — Assessment & Plan Note (Signed)
Symptoms worsened by Suboxone withdrawal. Will continue prn Clonazepam. Follow up 4 weeks and prn.

## 2014-12-20 NOTE — Assessment & Plan Note (Signed)
General medical exam normal today. Encouraged continued healthy diet and exercise. Reviewed labs from 03/2014. Flu vaccine today. Follow up for physical in 1 year.

## 2015-04-01 ENCOUNTER — Ambulatory Visit (INDEPENDENT_AMBULATORY_CARE_PROVIDER_SITE_OTHER): Payer: BLUE CROSS/BLUE SHIELD | Admitting: Family Medicine

## 2015-04-01 ENCOUNTER — Encounter: Payer: Self-pay | Admitting: Family Medicine

## 2015-04-01 VITALS — BP 114/72 | HR 68 | Temp 98.1°F | Ht 68.5 in | Wt 175.5 lb

## 2015-04-01 DIAGNOSIS — R599 Enlarged lymph nodes, unspecified: Secondary | ICD-10-CM

## 2015-04-01 DIAGNOSIS — R591 Generalized enlarged lymph nodes: Secondary | ICD-10-CM

## 2015-04-01 NOTE — Assessment & Plan Note (Signed)
New problem. Clinically this appears to be an enlarged lymph node. Likely reactive/postinfectious. It is in the area of the parotid gland but I doubt parotiditis or parotid gland issues as it is below the angle of the mandible. Patient denied discussed treatment/therapy options. Current guidelines for isolated lymphadenopathy encouraged watchful waiting for up to 4 weeks.  I offered the patient referral to ENT but he agrees with me that watchful waiting is a reasonable option. Lab work today. Patient to follow-up closely with PCP.

## 2015-04-01 NOTE — Progress Notes (Signed)
Subjective:  Patient ID: Matthew Curry, male    DOB: Nov 09, 1990  Age: 25 y.o. MRN: [id number]  CC: Swollen area on the neck  HPI:  25 year old male presents with the above complaint/concern.  Patient states that he had some soreness in the left side of his neck on Sunday. He states that he noticed a knot on Monday. This morning he woke up and it was quite swollen and painful. It is located just behind and below the angle of the mandible. He states that it is mildly painful/tender. He's been taking ibuprofen with no resolution. He has had a recent viral illness approximately 2 weeks ago. He denies any associated fever, chills, weight loss, night sweats. No other URI symptoms. No known inciting or exacerbating factors.  Social Hx   Social History   Social History  . Marital Status: Single    Spouse Name: N/A  . Number of Children: N/A  . Years of Education: N/A   Social History Main Topics  . Smoking status: Former Games developer  . Smokeless tobacco: None     Comment: quit 1 year ago  . Alcohol Use: Yes     Comment: few beers/day  . Drug Use: None  . Sexual Activity: Not Asked   Other Topics Concern  . None   Social History Narrative   Lives in [place] with girlfriend. No children. Has 2 dogs in home.      Work - Building control surveyor      Diet - regular      Exercise - Runs about 1x per week   Review of Systems  Constitutional: Negative for fever, chills and unexpected weight change.  HENT: Negative.   Respiratory: Negative.    Objective:  BP 114/72 mmHg  Pulse 68  Temp(Src) 98.1 F (36.7 C) (Oral)  Ht 5' 8.5" (1.74 m)  Wt 175 lb 8 oz (79.606 kg)  BMI 26.29 kg/m2  SpO2 98%  BP/Weight 04/01/2015 12/20/2014 08/12/2014  Systolic BP 114 106 122  Diastolic BP 72 55 62  Wt. (Lbs) 175.5 177 183.38  BMI 26.29 26.52 27.28   Physical Exam  Constitutional: He is oriented to person, place, and time. He appears well-developed. No distress.  HENT:  Head:  Normocephalic and atraumatic.  Right Ear: External ear normal.  Left Ear: External ear normal.  Mouth/Throat: Oropharynx is clear and moist. No oropharyngeal exudate.  Normal TMs bilaterally.  Neck: Normal range of motion.  Enlarged area noted at the angle of the mandible (slightly posterior and below).  It is tender palpation. Mobile and slightly warm. No surrounding erythema.  Cardiovascular: Normal rate and regular rhythm.   2/6 systolic murmur.  Pulmonary/Chest: Effort normal and breath sounds normal. No respiratory distress. He has no wheezes. He has no rales.  Abdominal: Soft. He exhibits no distension and no mass. There is no tenderness.  Neurological: He is alert and oriented to person, place, and time.  Psychiatric: He has a normal mood and affect.  Vitals reviewed.  Lab Results  Component Value Date   WBC 6.7 03/11/2014   HGB 16.0 03/11/2014   HCT 47.1 03/11/2014   PLT 208.0 03/11/2014   GLUCOSE 93 03/11/2014   CHOL 207* 03/11/2014   TRIG 233.0* 03/11/2014   HDL 47.30 03/11/2014   LDLDIRECT 127.9 03/11/2014   ALT 34 03/11/2014   AST 23 03/11/2014   NA 140 03/11/2014   K 4.5 03/11/2014   CL 104 03/11/2014   CREATININE 0.8  03/11/2014   BUN 14 03/11/2014   CO2 27 03/11/2014   TSH 1.27 03/11/2014    Assessment & Plan:   Problem List Items Addressed This Visit    Enlarged lymph node - Primary    New problem. Clinically this appears to be an enlarged lymph node. Likely reactive/postinfectious. It is in the area of the parotid gland but I doubt parotiditis or parotid gland issues as it is below the angle of the mandible. Patient denied discussed treatment/therapy options. Current guidelines for isolated lymphadenopathy encouraged watchful waiting for up to 4 weeks.  I offered the patient referral to ENT but he agrees with me that watchful waiting is a reasonable option. Lab work today. Patient to follow-up closely with PCP.      Relevant Orders   CBC w/Diff      Follow-up: PRN   Everlene Other DO [facility]

## 2015-04-01 NOTE — Patient Instructions (Signed)
Per guidelines we can observe this for up to 4 weeks.  Let me know if it worsens or fails to improve in the next week.  Continue Ibuprofen as needed.  Take care  Dr. Adriana Simas

## 2015-04-01 NOTE — Progress Notes (Signed)
Pre visit review using our clinic review tool, if applicable. No additional management support is needed unless otherwise documented below in the visit note. 

## 2015-04-02 ENCOUNTER — Telehealth: Payer: Self-pay

## 2015-04-02 NOTE — Telephone Encounter (Signed)
Wanted to know test results. I stated that he would receive a call or letter in the mail pending results

## 2015-04-03 ENCOUNTER — Telehealth: Payer: Self-pay | Admitting: *Deleted

## 2015-04-03 ENCOUNTER — Other Ambulatory Visit: Payer: BLUE CROSS/BLUE SHIELD

## 2015-04-03 NOTE — Telephone Encounter (Signed)
Hes coming back in Friday to see Dr. Adriana Simas, there are no appts for his redraw

## 2015-04-03 NOTE — Telephone Encounter (Signed)
Lab called saying pt blood hemolyzed (cbc) called pt didn't get an answer, will try again later

## 2015-04-03 NOTE — Telephone Encounter (Signed)
Patient was put in for tomorrow for labs at 345

## 2015-04-03 NOTE — Telephone Encounter (Signed)
FYI: in case he calls back.

## 2015-04-04 ENCOUNTER — Other Ambulatory Visit: Payer: BLUE CROSS/BLUE SHIELD

## 2015-04-04 ENCOUNTER — Encounter: Payer: Self-pay | Admitting: Family Medicine

## 2015-04-04 ENCOUNTER — Ambulatory Visit (INDEPENDENT_AMBULATORY_CARE_PROVIDER_SITE_OTHER): Payer: BLUE CROSS/BLUE SHIELD | Admitting: Family Medicine

## 2015-04-04 VITALS — BP 152/62 | HR 65 | Temp 98.3°F | Ht 68.5 in | Wt 174.5 lb

## 2015-04-04 DIAGNOSIS — R599 Enlarged lymph nodes, unspecified: Secondary | ICD-10-CM

## 2015-04-04 DIAGNOSIS — R591 Generalized enlarged lymph nodes: Secondary | ICD-10-CM | POA: Diagnosis not present

## 2015-04-04 LAB — CBC
HCT: 41.4 % (ref 39.0–52.0)
Hemoglobin: 14 g/dL (ref 13.0–17.0)
MCH: 29.1 pg (ref 26.0–34.0)
MCHC: 33.8 g/dL (ref 30.0–36.0)
MCV: 86.1 fL (ref 78.0–100.0)
MPV: 10.6 fL (ref 8.6–12.4)
Platelets: 171 10*3/uL (ref 150–400)
RBC: 4.81 MIL/uL (ref 4.22–5.81)
RDW: 13.4 % (ref 11.5–15.5)
WBC: 3.3 10*3/uL — ABNORMAL LOW (ref 4.0–10.5)

## 2015-04-04 NOTE — Progress Notes (Signed)
   Subjective:  Patient ID: Matthew Curry, male    DOB: 1990-08-12  Age: 25 y.o. MRN: [id number]  CC: Follow up regarding enlarged lymph node  HPI:  25 year old male presents to the clinic today for follow up.  Enlarged lymph node  Patient was recently seen on 1/24 by me. He had an enlarged lymph node.  He presents today for follow up.  He has had significant improvement; swelling/enlargement is nearly resolved.  He has had a recent viral illness since our last visit (nausea/vomiting). This resolved within 48 hours and he is currently feeling well.  No recent fever.  Social Hx   Social History   Social History  . Marital Status: Single    Spouse Name: N/A  . Number of Children: N/A  . Years of Education: N/A   Social History Main Topics  . Smoking status: Former Games developer  . Smokeless tobacco: None     Comment: quit 1 year ago  . Alcohol Use: Yes     Comment: few beers/day  . Drug Use: None  . Sexual Activity: Not Asked   Other Topics Concern  . None   Social History Narrative   Lives in [place] with girlfriend. No children. Has 2 dogs in home.      Work - Building control surveyor      Diet - regular      Exercise - Runs about 1x per week   Review of Systems  Constitutional: Negative for fever.  Hematological: Positive for adenopathy.   Objective:  BP 152/62 mmHg  Pulse 65  Temp(Src) 98.3 F (36.8 C) (Oral)  Ht 5' 8.5" (1.74 m)  Wt 174 lb 8 oz (79.153 kg)  BMI 26.14 kg/m2  SpO2 98%  BP/Weight 04/04/2015 04/01/2015 12/20/2014  Systolic BP 152 114 106  Diastolic BP 62 72 55  Wt. (Lbs) 174.5 175.5 177  BMI 26.14 26.29 26.52   Physical Exam  Constitutional: He is oriented to person, place, and time. He appears well-developed. No distress.  HENT:  Head: Normocephalic and atraumatic.  Neck: Neck supple.  Angle of the mandible (left) - no appreciable lymph node palpable.   Neurological: He is alert and oriented to person, place, and time.    Psychiatric: He has a normal mood and affect.  Vitals reviewed.   Lab Results  Component Value Date   WBC 3.3* 04/04/2015   HGB 14.0 04/04/2015   HCT 41.4 04/04/2015   PLT 171 04/04/2015   GLUCOSE 93 03/11/2014   CHOL 207* 03/11/2014   TRIG 233.0* 03/11/2014   HDL 47.30 03/11/2014   LDLDIRECT 127.9 03/11/2014   ALT 34 03/11/2014   AST 23 03/11/2014   NA 140 03/11/2014   K 4.5 03/11/2014   CL 104 03/11/2014   CREATININE 0.8 03/11/2014   BUN 14 03/11/2014   CO2 27 03/11/2014   TSH 1.27 03/11/2014    Assessment & Plan:   Problem List Items Addressed This Visit    Enlarged lymph node - Primary    Drastically improved; Could not palpate today. Advised follow up if recurs.      Relevant Orders   CBC (Completed)     Follow-up: PRN  Everlene Other DO [facility]

## 2015-04-04 NOTE — Progress Notes (Signed)
Pre visit review using our clinic review tool, if applicable. No additional management support is needed unless otherwise documented below in the visit note. 

## 2015-04-04 NOTE — Assessment & Plan Note (Signed)
Drastically improved; Could not palpate today. Advised follow up if recurs.

## 2015-06-30 ENCOUNTER — Other Ambulatory Visit: Payer: Self-pay | Admitting: Internal Medicine

## 2015-07-01 NOTE — Telephone Encounter (Signed)
Clonazepam refill request.  Last seen 04/04/15.  Last filled 12/20/14.  Please advise.

## 2016-04-07 ENCOUNTER — Encounter: Payer: BLUE CROSS/BLUE SHIELD | Admitting: Family Medicine

## 2016-04-26 ENCOUNTER — Encounter: Payer: Self-pay | Admitting: Family Medicine

## 2016-04-26 ENCOUNTER — Ambulatory Visit (INDEPENDENT_AMBULATORY_CARE_PROVIDER_SITE_OTHER): Payer: BLUE CROSS/BLUE SHIELD | Admitting: Family Medicine

## 2016-04-26 VITALS — BP 140/80 | HR 67 | Temp 97.6°F | Ht 69.0 in | Wt 178.0 lb

## 2016-04-26 DIAGNOSIS — F411 Generalized anxiety disorder: Secondary | ICD-10-CM | POA: Diagnosis not present

## 2016-04-26 DIAGNOSIS — D72819 Decreased white blood cell count, unspecified: Secondary | ICD-10-CM | POA: Diagnosis not present

## 2016-04-26 DIAGNOSIS — Z0001 Encounter for general adult medical examination with abnormal findings: Secondary | ICD-10-CM

## 2016-04-26 DIAGNOSIS — M545 Low back pain, unspecified: Secondary | ICD-10-CM | POA: Insufficient documentation

## 2016-04-26 DIAGNOSIS — E559 Vitamin D deficiency, unspecified: Secondary | ICD-10-CM

## 2016-04-26 NOTE — Assessment & Plan Note (Signed)
Likely muscular back strain. No red flags. Neurologically intact in his lower extremities. Discussed continued heat and staying active. Ibuprofen 800 mg every 8 hours as needed. Given return precautions.

## 2016-04-26 NOTE — Progress Notes (Signed)
Pre visit review using our clinic review tool, if applicable. No additional management support is needed unless otherwise documented below in the visit note. 

## 2016-04-26 NOTE — Progress Notes (Signed)
Matthew Rumps, MD Phone: [phone]  Matthew Curry is a 26 y.o. male who presents today for physical exam.  Diet is described as healthy. He eats lots of lean proteins and vegetables. Exercises every day by lifting twice a day and doing CrossFit. Quit smoking a year ago. Rare alcohol use. No illicit drug use. Prior HIV testing noted 6 years ago. Vaccinations up-to-date.  Patient notes 1 week of low back discomfort. Notes he bent over to pick something up at his mom's house and noted some discomfort after this. Notes no numbness or weakness or radiation. No saddle anesthesia or bowel or bladder incontinence. No fevers. Has been using heat and ice.  Anxiety: Followed by psychiatry for this. Sees him once a month. He is currently on Suboxone given history of narcotic use. Occasionally gets panic attacks. He has been doing breathing exercises for these. No depression.  Active Ambulatory Problems    Diagnosis Date Noted  . Generalized anxiety disorder 03/11/2014  . Shortness of breath 03/11/2014  . Skin nodule 03/11/2014  . Encounter for general adult medical examination with abnormal findings 12/20/2014  . Narcotic withdrawal ([place]) 12/20/2014  . Enlarged lymph node 04/01/2015  . Low back pain 04/26/2016   Resolved Ambulatory Problems    Diagnosis Date Noted  . ANXIETY 07/15/2008  . Obsessive-compulsive disorders 07/15/2008  . GERD 07/15/2008  . CHEST PAIN-UNSPECIFIED 07/15/2008  . Mouth ulcer 08/12/2014   Past Medical History:  Diagnosis Date  . Anxiety   . Chest tightness or pressure   . GERD (gastroesophageal reflux disease)   . History of narcotic addiction ([place])   . OCD (obsessive compulsive disorder)     Family History  Problem Relation Age of Onset  . Coronary artery disease Father     several of Pt family members(not mother or sibilings) have "leak valves"  . Diabetes Father     Social History   Social History  . Marital status: Single    Spouse  name: N/A  . Number of children: N/A  . Years of education: N/A   Occupational History  . Not on file.   Social History Main Topics  . Smoking status: Former Research scientist (life sciences)  . Smokeless tobacco: Never Used     Comment: quit 1 year ago  . Alcohol use Yes     Comment: few beers/day  . Drug use: Unknown  . Sexual activity: Not on file   Other Topics Concern  . Not on file   Social History Narrative   Lives in [place] with girlfriend. No children. Has 2 dogs in home.      Work - Associate Professor      Diet - regular      [place] about 1x per week    ROS  General:  Negative for nexplained weight loss, fever Skin: Negative for new or changing mole, sore that won't heal HEENT: Negative for trouble hearing, trouble seeing, ringing in ears, mouth sores, hoarseness, change in voice, dysphagia. CV:  Negative for chest pain, dyspnea, edema, palpitations Resp: Negative for cough, dyspnea, hemoptysis GI: Negative for nausea, vomiting, diarrhea, constipation, abdominal pain, melena, hematochezia. GU: Negative for dysuria, incontinence, urinary hesitance, hematuria, vaginal or penile discharge, polyuria, sexual difficulty, lumps in testicle or breasts MSK: Positive for back pain, Negative for muscle cramps or aches, joint pain or swelling Neuro: Negative for headaches, weakness, numbness, dizziness, passing out/fainting Psych: Positive for anxiety, Negative for depression, memory problems  Objective  Physical Exam  Vitals:   04/26/16 1440  BP: 140/80  Pulse: 67  Temp: 97.6 F (36.4 C)    BP Readings from Last 3 Encounters:  04/26/16 140/80  04/04/15 (!) 152/62  04/01/15 114/72   Wt Readings from Last 3 Encounters:  04/26/16 178 lb (80.7 kg)  04/04/15 174 lb 8 oz (79.2 kg)  04/01/15 175 lb 8 oz (79.6 kg)    Physical Exam  Constitutional: No distress.  HENT:  Head: Normocephalic and atraumatic.  Mouth/Throat: Oropharynx is clear and moist. No  oropharyngeal exudate.  Eyes: Conjunctivae are normal. Pupils are equal, round, and reactive to light.  Cardiovascular: Normal rate, regular rhythm and normal heart sounds.   Pulmonary/Chest: Effort normal and breath sounds normal.  Abdominal: Soft. Bowel sounds are normal. He exhibits no distension. There is no tenderness.  Musculoskeletal: He exhibits no edema.  No midline spine tenderness, no midline spine step-off, no muscular back tenderness  Neurological: He is alert. Gait normal.  5 out of 5 strength bilateral quads, hamstrings, plantar flexion, and dorsiflexion, sensation to light touch intact in bilateral lower extremities  Skin: Skin is warm and dry. He is not diaphoretic.  Psychiatric: Mood and affect normal.     Assessment/Plan:   Encounter for general adult medical examination with abnormal findings Overall doing well. Encouraged continued diet and exercise. Vaccinations up-to-date. HIV testing up-to-date. We will check lab work as outlined below.  Generalized anxiety disorder Stable. Following with psychiatry.  Low back pain Likely muscular back strain. No red flags. Neurologically intact in his lower extremities. Discussed continued heat and staying active. Ibuprofen 800 mg every 8 hours as needed. Given return precautions.   Orders Placed This Encounter  Procedures  . Comp Met (CMET)  . CBC  . Vitamin D (25 hydroxy)    No orders of the defined types were placed in this encounter.    Matthew Rumps, MD [place]

## 2016-04-26 NOTE — Assessment & Plan Note (Addendum)
Stable. Following with psychiatry.

## 2016-04-26 NOTE — Patient Instructions (Signed)
Nice to meet you. Please continue to work on diet and exercise. We will check some lab work and contact you with the results. You can continue to use heat for your back. You may also use ibuprofen 800 mg by mouth every 8 hours as needed. Please try to stay active though did not do any extreme lifting.

## 2016-04-26 NOTE — Assessment & Plan Note (Signed)
Overall doing well. Encouraged continued diet and exercise. Vaccinations up-to-date. HIV testing up-to-date. We will check lab work as outlined below.

## 2016-04-27 LAB — COMPREHENSIVE METABOLIC PANEL
ALBUMIN: 4.9 g/dL (ref 3.5–5.2)
ALT: 30 U/L (ref 0–53)
AST: 22 U/L (ref 0–37)
Alkaline Phosphatase: 33 U/L — ABNORMAL LOW (ref 39–117)
BUN: 15 mg/dL (ref 6–23)
CHLORIDE: 105 meq/L (ref 96–112)
CO2: 28 meq/L (ref 19–32)
CREATININE: 0.9 mg/dL (ref 0.40–1.50)
Calcium: 9.7 mg/dL (ref 8.4–10.5)
GFR: 108.42 mL/min (ref >60.00)
GLUCOSE: 90 mg/dL (ref 70–99)
Potassium: 4.2 mEq/L (ref 3.5–5.1)
SODIUM: 139 meq/L (ref 135–145)
Total Bilirubin: 0.7 mg/dL (ref 0.2–1.2)
Total Protein: 7.1 g/dL (ref 6.0–8.3)

## 2016-04-27 LAB — VITAMIN D 25 HYDROXY (VIT D DEFICIENCY, FRACTURES): VITD: 28.69 ng/mL — AB (ref 30.00–100.00)

## 2016-04-27 LAB — CBC
HEMATOCRIT: 45 % (ref 39.0–52.0)
HEMOGLOBIN: 15.8 g/dL (ref 13.0–17.0)
MCHC: 35 g/dL (ref 30.0–36.0)
MCV: 85.6 fl (ref 78.0–100.0)
Platelets: 193 10*3/uL (ref 150.0–400.0)
RBC: 5.26 Mil/uL (ref 4.22–5.81)
RDW: 13.1 % (ref 11.5–15.5)
WBC: 5.5 10*3/uL (ref 4.0–10.5)

## 2016-05-27 ENCOUNTER — Ambulatory Visit (INDEPENDENT_AMBULATORY_CARE_PROVIDER_SITE_OTHER): Payer: 59 | Admitting: Family Medicine

## 2016-05-27 ENCOUNTER — Encounter: Payer: Self-pay | Admitting: Family Medicine

## 2016-05-27 DIAGNOSIS — R030 Elevated blood-pressure reading, without diagnosis of hypertension: Secondary | ICD-10-CM | POA: Insufficient documentation

## 2016-05-27 DIAGNOSIS — L989 Disorder of the skin and subcutaneous tissue, unspecified: Secondary | ICD-10-CM | POA: Diagnosis not present

## 2016-05-27 DIAGNOSIS — J029 Acute pharyngitis, unspecified: Secondary | ICD-10-CM | POA: Diagnosis not present

## 2016-05-27 NOTE — Assessment & Plan Note (Signed)
Occurred after swallowing something and feeling as though it stuck in his right posterior oropharynx very briefly. Suspect this is topical injury with scab formation given his description of the events prior to the discomfort. Mild erythema today. He is status post tonsillectomy. No white spot noted. Discussed continuing to monitor over the next week and if not improving referring to ENT for better visualization.

## 2016-05-27 NOTE — Patient Instructions (Signed)
Nice to see you. Please watch the spot on your foot for the next 1-2 weeks and if it does not improve please let us know so we can refer you to dermatology. Please watch the area in your throat as well over the next week. If it does not improve please let us know so we can refer you to ear nose and throat. Please start monitoring your blood pressure at home. We'll see back in a month or so to recheck your blood pressure.

## 2016-05-27 NOTE — Assessment & Plan Note (Signed)
Suspect the area on his right posterior heel are burst blood vessels related to his shoe rubbing wrong in that area. Less likely plantar wart. Advised to monitor for 1-2 weeks and if it does not improve we could have him see dermatology.

## 2016-05-27 NOTE — Progress Notes (Signed)
  Matthew AlarEric Quante Pettry, MD Phone: [phone]  Matthew Curry is a 26 y.o. male who presents today for same-day visit.  Patient notes there is a spot on his right posterior heel that has been there for 1-2 months. They look like seeds underneath the skin. Initially saw it in the shower. He had been having issues with her shoes rubbing on the back of his foot prior to this. Notes the seeds have moved closer to the skin. It has not worsened. There is no pain.  Patient notes on 2 occasions over the last month or so he has felt food sort of stick in the right side of his oropharynx and then go down when he swallows. He notes the day after this happens he has a little bit of pain in that area and then noted a white spot. Most recently this occurred 4-5 days ago. He has had a soreness in this area. He states there is a white spot there. He's not had any fever or cough or trouble swallowing. He does note minimal sinus drainage.  He reports blood pressures have been minimally elevated in the past though typically return to normal. He checks it occasionally at work and it is 100/60 typically. He's never been on medication for his blood pressure. He notes no chest pain.  PMH: nonsmoker.   ROS see history of present illness  Objective  Physical Exam Vitals:   05/27/16 0823 05/27/16 0851  BP: (!) 160/78 (!) 152/90  Pulse: 81   Temp: 98.1 F (36.7 C)     BP Readings from Last 3 Encounters:  05/27/16 (!) 152/90  04/26/16 140/80  04/04/15 (!) 152/62   Wt Readings from Last 3 Encounters:  05/27/16 186 lb 6.4 oz (84.6 kg)  04/26/16 178 lb (80.7 kg)  04/04/15 174 lb 8 oz (79.2 kg)    Physical Exam  Constitutional: No distress.  HENT:  Head: Normocephalic and atraumatic.  Mild erythema right posterior oropharynx with no exudate or white spot noted  Eyes: Conjunctivae are normal. Pupils are equal, round, and reactive to light.  Cardiovascular: Normal rate, regular rhythm and normal heart  sounds.   Pulmonary/Chest: Effort normal and breath sounds normal.  Skin: He is not diaphoretic.  Right posterior heel with a small area of small little black dots just underneath the skin surface, there is no tenderness, there is no callus     Assessment/Plan: Please see individual problem list.  Skin lesion Suspect the area on his right posterior heel are burst blood vessels related to his shoe rubbing wrong in that area. Less likely plantar wart. Advised to monitor for 1-2 weeks and if it does not improve we could have him see dermatology.  Sore throat Occurred after swallowing something and feeling as though it stuck in his right posterior oropharynx very briefly. Suspect this is topical injury with scab formation given his description of the events prior to the discomfort. Mild erythema today. He is status post tonsillectomy. No white spot noted. Discussed continuing to monitor over the next week and if not improving referring to ENT for better visualization.  Elevated blood pressure reading Elevated today. He reports normal values when he checks at work occasionally. Discussed checking at home over the next 1-2 weeks and then following up for recheck with the nurse. If still elevated or elevated at home could consider starting a medication.  Matthew AlarEric Shatera Rennert, MD [facility]

## 2016-05-27 NOTE — Assessment & Plan Note (Signed)
Elevated today. He reports normal values when he checks at work occasionally. Discussed checking at home over the next 1-2 weeks and then following up for recheck with the nurse. If still elevated or elevated at home could consider starting a medication.

## 2016-05-27 NOTE — Progress Notes (Signed)
Pre visit review using our clinic review tool, if applicable. No additional management support is needed unless otherwise documented below in the visit note. 

## 2016-06-25 ENCOUNTER — Ambulatory Visit
Admission: EM | Admit: 2016-06-25 | Discharge: 2016-06-25 | Disposition: A | Discharge status: Home / Self Care | Payer: 59 | Attending: Family Medicine | Admitting: Family Medicine

## 2016-06-25 ENCOUNTER — Encounter: Payer: Self-pay | Admitting: Gynecology

## 2016-06-25 ENCOUNTER — Telehealth: Payer: Self-pay | Admitting: *Deleted

## 2016-06-25 DIAGNOSIS — J069 Acute upper respiratory infection, unspecified: Secondary | ICD-10-CM

## 2016-06-25 DIAGNOSIS — H6692 Otitis media, unspecified, left ear: Secondary | ICD-10-CM | POA: Diagnosis not present

## 2016-06-25 DIAGNOSIS — T20212A Burn of second degree of left ear [any part, except ear drum], initial encounter: Secondary | ICD-10-CM

## 2016-06-25 DIAGNOSIS — Z23 Encounter for immunization: Secondary | ICD-10-CM

## 2016-06-25 DIAGNOSIS — X19XXXA Contact with other heat and hot substances, initial encounter: Secondary | ICD-10-CM

## 2016-06-25 MED ORDER — BACITRACIN ZINC 500 UNIT/GM EX OINT: 1.0000 "application " | TOPICAL_OINTMENT | Freq: Two times a day (BID) | CUTANEOUS | 0 refills | Status: DC

## 2016-06-25 MED ORDER — TETANUS-DIPHTH-ACELL PERTUSSIS 5-2.5-18.5 LF-MCG/0.5 IM SUSP
0.5000 mL | Freq: Once | INTRAMUSCULAR | Status: AC
  Administered 2016-06-25: 0.5 mL via INTRAMUSCULAR

## 2016-06-25 MED ORDER — AMOXICILLIN 875 MG PO TABS
875.0000 mg | ORAL_TABLET | Freq: Two times a day (BID) | ORAL | 0 refills | Status: DC
Start: 2016-06-25 — End: 2017-05-13
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
## 2016-06-25 NOTE — Discharge Instructions (Signed)
Take medication as prescribed. Keep area clean to left ear and monitor. Drink plenty of fluids.   Follow up with your primary care physician in one week. Return to Urgent care for new or worsening concerns.
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
## 2016-06-25 NOTE — Telephone Encounter (Signed)
Agree that patient should be evaluated for this. Thanks.
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
## 2017-05-13 ENCOUNTER — Encounter: Payer: Self-pay | Admitting: Family Medicine

## 2017-05-13 ENCOUNTER — Ambulatory Visit: Payer: 59 | Admitting: Family Medicine

## 2017-05-13 ENCOUNTER — Other Ambulatory Visit: Payer: Self-pay

## 2017-05-13 VITALS — BP 140/70 | HR 75 | Temp 97.8°F | Wt 188.6 lb

## 2017-05-13 DIAGNOSIS — L989 Disorder of the skin and subcutaneous tissue, unspecified: Secondary | ICD-10-CM | POA: Diagnosis not present

## 2017-05-13 DIAGNOSIS — R1011 Right upper quadrant pain: Secondary | ICD-10-CM | POA: Diagnosis not present

## 2017-05-13 DIAGNOSIS — R030 Elevated blood-pressure reading, without diagnosis of hypertension: Secondary | ICD-10-CM | POA: Diagnosis not present

## 2017-05-13 DIAGNOSIS — R61 Generalized hyperhidrosis: Secondary | ICD-10-CM | POA: Diagnosis not present

## 2017-05-13 LAB — COMPREHENSIVE METABOLIC PANEL
ALT: 32 U/L (ref 0–53)
AST: 21 U/L (ref 0–37)
Albumin: 4.7 g/dL (ref 3.5–5.2)
Alkaline Phosphatase: 35 U/L — ABNORMAL LOW (ref 39–117)
BUN: 14 mg/dL (ref 6–23)
CALCIUM: 9.8 mg/dL (ref 8.4–10.5)
CHLORIDE: 103 meq/L (ref 96–112)
CO2: 29 mEq/L (ref 19–32)
CREATININE: 1.04 mg/dL (ref 0.40–1.50)
GFR: 91.03 mL/min (ref >60.00)
GLUCOSE: 103 mg/dL — AB (ref 70–99)
Potassium: 4.4 mEq/L (ref 3.5–5.1)
Sodium: 139 mEq/L (ref 135–145)
Total Bilirubin: 0.9 mg/dL (ref 0.2–1.2)
Total Protein: 7.4 g/dL (ref 6.0–8.3)

## 2017-05-13 LAB — CBC WITH DIFFERENTIAL/PLATELET
Basophils Absolute: 0 K/uL (ref 0.0–0.1)
Basophils Relative: 0.5 % (ref 0.0–3.0)
Eosinophils Absolute: 0.1 K/uL (ref 0.0–0.7)
Eosinophils Relative: 1.4 % (ref 0.0–5.0)
HCT: 48.4 % (ref 39.0–52.0)
Hemoglobin: 16.7 g/dL (ref 13.0–17.0)
Lymphocytes Relative: 20.9 % (ref 12.0–46.0)
Lymphs Abs: 1.1 K/uL (ref 0.7–4.0)
MCHC: 34.5 g/dL (ref 30.0–36.0)
MCV: 88.1 fl (ref 78.0–100.0)
Monocytes Absolute: 0.5 K/uL (ref 0.1–1.0)
Monocytes Relative: 9 % (ref 3.0–12.0)
Neutro Abs: 3.5 K/uL (ref 1.4–7.7)
Neutrophils Relative %: 68.2 % (ref 43.0–77.0)
Platelets: 216 K/uL (ref 150.0–400.0)
RBC: 5.49 Mil/uL (ref 4.22–5.81)
RDW: 12.8 % (ref 11.5–15.5)
WBC: 5.2 K/uL (ref 4.0–10.5)

## 2017-05-13 NOTE — Assessment & Plan Note (Signed)
Borderline elevated in the office.  He reports he checked it previously at home and it was in the normal range at home.  Will start checking it again and if it is not in the normal range he will contact us for follow-up.

## 2017-05-13 NOTE — Assessment & Plan Note (Signed)
I suspect this is related to muscular strain though given the location we will check a CMP and CBC with differential.  Discussed taking a break from working out.  Discussed monitoring his bowel movements and if he is not starting to have a good bowel movement trying over-the-counter MiraLAX.

## 2017-05-13 NOTE — Patient Instructions (Signed)
Nice to see you. We will check lab work today and contact you with the results. Please try to take a week off from going to the gym and see if that helps with your discomfort. Please follow-up with dermatology regarding your armpit sweating.

## 2017-05-13 NOTE — Assessment & Plan Note (Signed)
Following with dermatology.  He will continue to see them and continue the topical treatment.  If not improving he will see dermatology.

## 2017-05-13 NOTE — Assessment & Plan Note (Signed)
Resolved. Monitor for recurrence. 

## 2017-05-13 NOTE — Progress Notes (Signed)
  Tommi Rumps, MD Phone: [phone]  Matthew Curry is a 27 y.o. male who presents today for same-day visit.  Right upper quadrant pain: Patient notes 10 days ago he was working out and initially felt like he pulled something in his right lateral abdomen.  Intermittently bothered him over the last 10 days.  He has gone some days without any discomfort.  He notes now it is more in his right upper quadrant.  He did travel to [place] after the pain started and did develop some diarrhea on returning though that has resolved.  No blood in his diarrhea.  No nausea or vomiting.  No dysuria or hematuria.  Can happen at night or during the day.  He has still been exercising.  Last bowel movement was yesterday. No fevers.   He previously had a skin lesion on his heel and he notes that has resolved.  He has seen dermatology recently for excessive armpit sweating.  They placed him on topical pads which have been somewhat beneficial.  He thinks this started around the time he came off of his Suboxone though he has been off of Suboxone for 9+ months.  Social History   Tobacco Use  Smoking Status Former Smoker  Smokeless Tobacco Never Used  Tobacco Comment   quit 1 year ago     ROS see history of present illness  Objective  Physical Exam Vitals:   05/13/17 0822 05/13/17 0905  BP: (!) 144/82 140/70  Pulse: 75   Temp: 97.8 F (36.6 C)   SpO2: 97%     BP Readings from Last 3 Encounters:  05/13/17 140/70  06/25/16 125/68  05/27/16 (!) 152/90   Wt Readings from Last 3 Encounters:  05/13/17 188 lb 9.6 oz (85.5 kg)  06/25/16 180 lb (81.6 kg)  05/27/16 186 lb 6.4 oz (84.6 kg)    Physical Exam  Constitutional: No distress.  Cardiovascular: Normal rate, regular rhythm and normal heart sounds.  Pulmonary/Chest: Effort normal and breath sounds normal.  Abdominal: Soft. Bowel sounds are normal.  Slight discomfort on palpation of the right upper quadrant, no distention, no guarding,  no rebound, negative Murphy sign  Musculoskeletal: He exhibits no edema.  Neurological: He is alert. Gait normal.  Skin: Skin is warm and dry. He is not diaphoretic.     Assessment/Plan: Please see individual problem list.  Skin lesion Resolved.  Monitor for recurrence.  Focal axillary diaphoresis Following with dermatology.  He will continue to see them and continue the topical treatment.  If not improving he will see dermatology.  RUQ pain I suspect this is related to muscular strain though given the location we will check a CMP and CBC with differential.  Discussed taking a break from working out.  Discussed monitoring his bowel movements and if he is not starting to have a good bowel movement trying over-the-counter MiraLAX.  Elevated blood pressure reading Borderline elevated in the office.  He reports he checked it previously at home and it was in the normal range at home.  Will start checking it again and if it is not in the normal range he will contact us for follow-up.   Orders Placed This Encounter  Procedures  . Comp Met (CMET)  . CBC w/Diff    No orders of the defined types were placed in this encounter.    Tommi Rumps, MD [place]

## 2017-08-15 NOTE — Progress Notes (Signed)
[name] [address] [place], [place] [address] Phone: [phone] Subjective:    I'm seeing this patient by the request  of:  Glori Luis, MD   CC: Right ankle pain  BJY:NWGNFAOZHY  Matthew Curry is a 27 y.o. male coming in with complaint of right ankle pain Broke his ankle 5 years ago. States that he has pain in the entire ankle. Has pain with running, kick ball and playing softball. Has tried bracing when his ankle pain does increase. Does have some numbness and tingling in the toes with cardiovascular exercises ie. Treadmill, elliptical.  Patient states that also with squatting seems to give him some discomfort and pain as well.  Symptoms could be associated with a tingling sensation.  Denies any constant numbness.  Patient had been able to control it for many years but is now having more pain.  Patient also notes that he was hit in the right elbow with a baseball this weekend. Woke up with significant swelling and took himself into the ED. Was put in a sling and told he had a bruise.     Past Medical History:  Diagnosis Date  . Anxiety   . Chest tightness or pressure   . GERD (gastroesophageal reflux disease)   . History of narcotic addiction (HCC)    Previously on Suboxone, followed in [place]  . Narcotic withdrawal (HCC) 12/20/2014  . OCD (obsessive compulsive disorder)    Past Surgical History:  Procedure Laterality Date  . ADENOIDECTOMY  1998   Social History   Socioeconomic History  . Marital status: Single    Spouse name: Not on file  . Number of children: Not on file  . Years of education: Not on file  . Highest education level: Not on file  Occupational History  . Not on file  Social Needs  . Financial resource strain: Not on file  . Food insecurity:    Worry: Not on file    Inability: Not on file  . Transportation needs:    Medical: Not on file    Non-medical: Not on file  Tobacco Use  . Smoking status:  Former Games developer  . Smokeless tobacco: Never Used  . Tobacco comment: quit 1 year ago  Substance and Sexual Activity  . Alcohol use: Yes    Comment: few beers/day  . Drug use: Yes  . Sexual activity: Not on file  Lifestyle  . Physical activity:    Days per week: Not on file    Minutes per session: Not on file  . Stress: Not on file  Relationships  . Social connections:    Talks on phone: Not on file    Gets together: Not on file    Attends religious service: Not on file    Active member of club or organization: Not on file    Attends meetings of clubs or organizations: Not on file    Relationship status: Not on file  Other Topics Concern  . Not on file  Social History Narrative   Lives in [place] with girlfriend. No children. Has 2 dogs in home.      Work - Building control surveyor      Diet - regular      Exercise - Runs about 1x per week   Allergies  Allergen Reactions  . Sulfonamide Derivatives    Family History  Problem Relation Age of Onset  . Coronary artery disease Father  several of Pt family members(not mother or sibilings) have "leak valves"  . Diabetes Father      Past medical history, social, surgical and family history all reviewed in electronic medical record.  No pertanent information unless stated regarding to the chief complaint.   Review of Systems:Review of systems updated and as accurate as of 08/16/17  No headache, visual changes, nausea, vomiting, diarrhea, constipation, dizziness, abdominal pain, skin rash, fevers, chills, night sweats, weight loss, swollen lymph nodes, body aches, joint swelling, muscle aches, chest pain, shortness of breath, mood changes.   Objective  Blood pressure 140/82, pulse 74, height 5\' 10"  (1.778 m), weight 186 lb (84.4 kg), SpO2 98 %. Systems examined below as of 08/16/17   General: No apparent distress alert and oriented x3 mood and affect normal, dressed appropriately.  HEENT: Pupils equal, extraocular  movements intact  Respiratory: Patient's speak in full sentences and does not appear short of breath  Cardiovascular: No lower extremity edema, non tender, no erythema  Skin: Warm dry intact with no signs of infection or rash on extremities or on axial skeleton.  Abdomen: Soft nontender  Neuro: Cranial nerves II through XII are intact, neurovascularly intact in all extremities with 2+ DTRs and 2+ pulses.  Lymph: No lymphadenopathy of posterior or anterior cervical chain or axillae bilaterally.  Gait normal with good balance and coordination.  MSK:  Non tender with full range of motion and good stability and symmetric strength and tone of shoulders, elbows, wrist, hip, knee bilaterally.  Ankle: Right No visible erythema or swelling. Range of motion is full in all directions. Strength is 4/5 in all directions.  But symmetric Stable lateral and medial ligaments; squeeze test and kleiger test unremarkable; Talar dome nontender; No pain at base of 5th MT; No tenderness over cuboid; No tenderness over N spot or navicular prominence No tenderness on posterior aspects of lateral and medial malleolus No sign of peroneal tendon subluxations or tenderness to palpation Mild positive tarsal tunnel tinel's Able to walk 4 steps. Contralateral ankle unremarkable  MSK US performed of: Right ankle This study was ordered, performed, and interpreted by Terrilee Files D.O.  Foot/Ankle:   All structures visualized.   Talar dome does have some very mild narrowing noted over the medial place Ankle mortise without effusion. Peroneus longus and brevis tendons unremarkable on long and transverse views without sheath effusions. Posterior tibialis this is a very mild hypoechoic changes. Achilles tendon visualized along length of tendon and unremarkable on long and transverse views without sheath effusion. Anterior Talofibular Ligament and Calcaneofibular Ligaments unremarkable and intact. Deltoid Ligament  unremarkable and intact. Plantar fascia intact and without effusion, normal thickness. No increased doppler signal, cap sign, or thickening of tibial cortex. Power doppler signal normal.  IMPRESSION:   Mild tarsal tunnel syndrome potentially and mild posterior tibialis tendinitis  97110; 15 additional minutes spent for Therapeutic exercises as stated in above notes.  This included exercises focusing on stretching, strengthening, with significant focus on eccentric aspects.   Long term goals include an improvement in range of motion, strength, endurance as well as avoiding reinjury. Patient's frequency would include in 1-2 times a day, 3-5 times a week for a duration of 6-12 weeks. Ankle strengthening that included:  Basic range of motion exercises to allow proper full motion at ankle Stretching of the lower leg and hamstrings  Theraband exercises for the lower leg - inversion, eversion, dorsiflexion and plantarflexion each to be completed with a theraband Balance exercises to increase  proprioception Weight bearing exercises to increase strength and balance  Proper technique shown and discussed handout in great detail with ATC.  All questions were discussed and answered.      Impression and Recommendations:     This case required medical decision making of moderate complexity.      Note: This dictation was prepared with Dragon dictation along with smaller phrase technology. Any transcriptional errors that result from this process are unintentional.

## 2017-08-16 ENCOUNTER — Encounter: Payer: Self-pay | Admitting: Family Medicine

## 2017-08-16 ENCOUNTER — Ambulatory Visit: Payer: Self-pay

## 2017-08-16 ENCOUNTER — Ambulatory Visit: Payer: 59 | Admitting: Family Medicine

## 2017-08-16 VITALS — BP 140/82 | HR 74 | Ht 70.0 in | Wt 186.0 lb

## 2017-08-16 DIAGNOSIS — M25571 Pain in right ankle and joints of right foot: Secondary | ICD-10-CM

## 2017-08-16 DIAGNOSIS — G5751 Tarsal tunnel syndrome, right lower limb: Secondary | ICD-10-CM

## 2017-08-16 DIAGNOSIS — G8929 Other chronic pain: Secondary | ICD-10-CM | POA: Diagnosis not present

## 2017-08-16 DIAGNOSIS — G5750 Tarsal tunnel syndrome, unspecified lower limb: Secondary | ICD-10-CM | POA: Insufficient documentation

## 2017-08-16 NOTE — Patient Instructions (Signed)
Good to see you  Ice 20 minutes 2 times daily. Usually after activity and before bed. pennsaid pinkie amount topically 2 times daily as needed.  Exercises 3 times a week.  With heavy lifting consider spenco orthotics total support or heel lift in your shoes.  Over the counter get  Vitamin D 2000 IU dialy  Turmeric 500mg  daily  See me again in 4 weeks if not gone

## 2017-08-16 NOTE — Assessment & Plan Note (Signed)
Partial tarsal tunnel syndrome.  Discussed some mild posterior tibialis tendinitis.  We discussed heel lift, home exercise, icing regimen.  We discussed topical anti-inflammatories.  We discussed over-the-counter medications.  Discussed proper shoes.  Patient knows that in 4 weeks if not resolved come back again.  Otherwise follow-up as needed

## 2017-09-14 ENCOUNTER — Ambulatory Visit: Payer: 59 | Admitting: Family Medicine

## 2017-10-03 NOTE — Progress Notes (Signed)
Matthew ScaleZach Demar Shad D.O. [facility] [address] [place], [place] [address] Phone: [phone] Subjective:     CC: Ankle pain  BJY:NWGNFAOZHYHPI:Subjective  Matthew Curry AnesGallagher is a 27 y.o. male coming in with complaint of ankle pain. Ankle is doing fine. Noticed a knot on his right leg about a year ago. Could only see it during squats. He as recently started doing heavier weight and has noticed that it has gone down.  Patient was found to have tarsal tunnel syndrome.  Since he has been doing the conservative therapy doing significantly better.  Has not had any sensations in his ankle at this time.  Onset- 1 year ago Location- right leg, anterior/lateral   Character- gets irritated Aggravating factors- Squat Reliving factors-  Therapies tried-  Severity-7 out of 10     Past Medical History:  Diagnosis Date  . Anxiety   . Chest tightness or pressure   . GERD (gastroesophageal reflux disease)   . History of narcotic addiction (HCC)    Previously on Suboxone, [place] in [place]  . Narcotic withdrawal (HCC) 12/20/2014  . OCD (obsessive compulsive disorder)    Past Surgical History:  Procedure Laterality Date  . ADENOIDECTOMY  1998   Social History   Socioeconomic History  . Marital status: Single    Spouse name: Not on file  . Number of children: Not on file  . Years of education: Not on file  . Highest education level: Not on file  Occupational History  . Not on file  Social Needs  . Financial resource strain: Not on file  . Food insecurity:    Worry: Not on file    Inability: Not on file  . Transportation needs:    Medical: Not on file    Non-medical: Not on file  Tobacco Use  . Smoking status: Former Games developermoker  . Smokeless tobacco: Never Used  . Tobacco comment: quit 1 year ago  Substance and Sexual Activity  . Alcohol use: Yes    Comment: few beers/day  . Drug use: Yes  . Sexual activity: Not on file  Lifestyle  . Physical activity:    Days per week: Not on  file    Minutes per session: Not on file  . Stress: Not on file  Relationships  . Social connections:    Talks on phone: Not on file    Gets together: Not on file    Attends religious service: Not on file    Active member of club or organization: Not on file    Attends meetings of clubs or organizations: Not on file    Relationship status: Not on file  Other Topics Concern  . Not on file  Social History Narrative   [place]ives in [place] with girlfriend. No children. Has 2 dogs in home.      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 Sulfonamide Derivatives    Family History  Problem Relation Age of Onset  . Coronary artery disease Father        several of Pt family members(not mother or sibilings) have "leak valves"  . Diabetes Father      Past medical history, social, surgical and family history all reviewed in electronic medical record.  No pertanent information unless stated regarding to the chief complaint.   Review of Systems:Review of systems  updated and as accurate as of 10/05/17  No headache, visual changes, nausea, vomiting, diarrhea, constipation, dizziness, abdominal pain, skin rash, fevers, chills, night sweats, weight loss, swollen lymph nodes, body aches, joint swelling, muscle aches, chest pain, shortness of breath, mood changes.   Objective  Blood pressure 138/78, pulse 70, height 5\' 10"  (1.778 m), weight 188 lb (85.3 kg), SpO2 96 %. Systems examined below as of 10/05/17   General: No apparent distress alert and oriented x3 mood and affect normal, dressed appropriately.  HEENT: Pupils equal, extraocular movements intact  Respiratory: Patient's speak in full sentences and does not appear short of breath  Cardiovascular: No lower extremity edema, non tender, no erythema  Skin: Warm dry intact with no signs of infection or rash on extremities or on axial skeleton.  Abdomen:  Soft nontender  Neuro: Cranial nerves II through XII are intact, neurovascularly intact in all extremities with 2+ DTRs and 2+ pulses.  Lymph: No lymphadenopathy of posterior or anterior cervical chain or axillae bilaterally.  Gait normal with good balance and coordination.  MSK:  Non tender with full range of motion and good stability and symmetric strength and tone of shoulders, elbows, wrist, hip, knee bilaterally.  Patient's left lateral tibial area and has an area that seems to be a varicose vein.  Ankle unremarkable with full range of motion.  Negative tarsal tunnel on Tinel's syndrome.  Full range of motion of the ankle with 5 out of 5 strength.    Impression and Recommendations:     This case required medical decision making of moderate complexity.      Note: This dictation was prepared with Dragon dictation along with smaller phrase technology. Any transcriptional errors that result from this process are unintentional.

## 2017-10-05 ENCOUNTER — Ambulatory Visit: Payer: 59 | Admitting: Family Medicine

## 2017-10-05 ENCOUNTER — Encounter: Payer: Self-pay | Admitting: Family Medicine

## 2017-10-05 DIAGNOSIS — G5751 Tarsal tunnel syndrome, right lower limb: Secondary | ICD-10-CM

## 2017-10-05 NOTE — Assessment & Plan Note (Signed)
Patient is a more of a tarsal tunnel syndrome but seems to be doing well with conservative therapy.  Continue the good shoes, home exercises intermittently and follow-up as needed

## 2017-10-05 NOTE — Patient Instructions (Signed)
Good to see you  [email or web address] Size large calf compression  Keep doing everything else If you need us call [phone] See me again when you need us!

## 2018-02-01 ENCOUNTER — Ambulatory Visit: Payer: 59 | Admitting: Family

## 2018-02-01 ENCOUNTER — Encounter: Payer: Self-pay | Admitting: Family

## 2018-02-01 VITALS — BP 128/82 | HR 85 | Temp 98.5°F | Wt 186.6 lb

## 2018-02-01 DIAGNOSIS — M62838 Other muscle spasm: Secondary | ICD-10-CM | POA: Diagnosis not present

## 2018-02-01 DIAGNOSIS — J019 Acute sinusitis, unspecified: Secondary | ICD-10-CM | POA: Diagnosis not present

## 2018-02-01 MED ORDER — NAPROXEN SODIUM 550 MG PO TABS: 550.0000 mg | ORAL_TABLET | Freq: Every day | ORAL | 0 refills | Status: DC | PRN

## 2018-02-01 MED ORDER — CYCLOBENZAPRINE HCL 5 MG PO TABS: 5.0000 mg | ORAL_TABLET | Freq: Every evening | ORAL | 0 refills | Status: DC | PRN

## 2018-02-01 MED ORDER — AMOXICILLIN-POT CLAVULANATE 875-125 MG PO TABS: 1.0000 | ORAL_TABLET | Freq: Two times a day (BID) | ORAL | 0 refills | Status: AC

## 2018-02-01 NOTE — Progress Notes (Signed)
Subjective:    Patient ID: Matthew Curry, male    DOB: 14-Jan-1991, 27 y.o.   MRN: [id number]  CC: Matthew Curry is a 27 y.o. male who presents today for an acute visit.    HPI: CC: right sided sinus pressure x 6 days, waxed and waned.   At times, had pressure on  Left side as well however staying now on right side. Feels pressure ; states no pain or sensitivity with eating.  Regular dental appointments.   Thought was 'getting a cold' 6 days ago, waxed and waned.  Tried sudafed and ibuprofen without resolve.   Had sore throat 6 days ago, resolved 3 days ago.  No fever , congestion, HA, vision changes.   Also described right upper back and side of neck are sore, x 6 days, waxed and waned.   Wife massaged upper back yesterday which helped, however pressure returned. Works out daily. Notes had heavy work out with squats 6 days ago and bench presses which correlates with onset of symptoms.   Notes ear infection right side 7-8 months ago. Suspect resolved     HISTORY:  Past Medical History:  Diagnosis Date  . Anxiety   . Chest tightness or pressure   . GERD (gastroesophageal reflux disease)   . History of narcotic addiction (HCC)    [place], [place] in [place]  . Narcotic withdrawal (HCC) 12/20/2014  . OCD (obsessive compulsive disorder)    Past Surgical History:  Procedure Laterality Date  . ADENOIDECTOMY  1998   Family History  Problem Relation Age of Onset  . Coronary artery disease Father        several of Pt family members(not mother or sibilings) have "leak valves"  . Diabetes Father     Allergies: Sulfonamide derivatives Current Outpatient Medications on File Prior to Visit  Medication Sig Dispense Refill  . cholecalciferol (VITAMIN D) 1000 units tablet Take 1,000 Units by mouth daily.     No current facility-administered medications on file prior to visit.     Social History   Tobacco Use  . Smoking status: Former Games developermoker  .  Smokeless tobacco: Never Used  . Tobacco comment: quit 1 year ago  Substance Use Topics  . Alcohol use: Yes    Comment: few beers/day  . Drug use: Yes    Review of Systems  Constitutional: Negative for chills and fever.  HENT: Positive for sinus pressure. Negative for congestion, sore throat and trouble swallowing.   Eyes: Negative for visual disturbance.  Respiratory: Negative for cough, shortness of breath and wheezing.   Cardiovascular: Negative for chest pain and palpitations.  Gastrointestinal: Negative for nausea and vomiting.  Musculoskeletal: Positive for back pain and neck pain (right sided).  Neurological: Negative for dizziness and headaches.      Objective:    BP 128/82 (BP Location: Right Arm, Patient Position: Sitting, Cuff Size: Normal)   Pulse 85   Temp 98.5 F (36.9 C)   Wt 186 lb 9.6 oz (84.6 kg)   SpO2 98%   BMI 26.77 kg/m    Physical Exam  Constitutional: Vital signs are normal. He appears well-developed and well-nourished.  HENT:  Head: Normocephalic and atraumatic.  Right Ear: Hearing, tympanic membrane, external ear and ear canal normal. No drainage, swelling or tenderness. Tympanic membrane is not injected, not erythematous and not bulging. No middle ear effusion. No decreased hearing is noted.  Left Ear: Hearing, tympanic membrane, external ear and ear canal normal. No  drainage, swelling or tenderness. Tympanic membrane is not injected, not erythematous and not bulging.  No middle ear effusion. No decreased hearing is noted.  Nose: Right sinus exhibits frontal sinus tenderness. Right sinus exhibits no maxillary sinus tenderness. Left sinus exhibits no maxillary sinus tenderness and no frontal sinus tenderness.  Mouth/Throat: Uvula is midline, oropharynx is clear and moist and mucous membranes are normal. No oropharyngeal exudate, posterior oropharyngeal edema, posterior oropharyngeal erythema or tonsillar abscesses.  No erythema, increased warmth noted  over frontal or maxillary sinuses.   No tenderness over temporal area.   Eyes: Conjunctivae are normal.  Neck: Normal range of motion and full passive range of motion without pain. Neck supple. Spinous process tenderness present. No neck rigidity. Normal range of motion present.    Diffuse tenderness as noted on diagram No erythema, increase in warmth. Skin intact.   Cardiovascular: Regular rhythm and normal heart sounds.  Pulmonary/Chest: Effort normal and breath sounds normal. No respiratory distress. He has no wheezes. He has no rhonchi. He has no rales.  Lymphadenopathy:       Head (right side): No submental, no submandibular, no tonsillar, no preauricular, no posterior auricular and no occipital adenopathy present.       Head (left side): No submental, no submandibular, no tonsillar, no preauricular, no posterior auricular and no occipital adenopathy present.    He has no cervical adenopathy.  Neurological: He is alert.  Skin: Skin is warm and dry.  Psychiatric: He has a normal mood and affect. His speech is normal and behavior is normal.  Vitals reviewed.      Assessment & Plan:   1. Muscle spasm Etiology of complaint appears to be in part musculoskeletal in nature.  Soreness along right side of the neck appears to correlate with intense workout last week.  Trial of naproxen sodium, as needed muscle relaxant.  Patient let me know how he is doing. - naproxen sodium (ANAPROX) 550 MG tablet; Take 1 tablet (550 mg total) by mouth daily as needed. May repeat dose in 12 hours if needed. Do not exceed two tablets in 24 hours. Take with food  Dispense: 30 tablet; Refill: 0 - cyclobenzaprine (FLEXERIL) 5 MG tablet; Take 1 tablet (5 mg total) by mouth at bedtime as needed for muscle spasms.  Dispense: 15 tablet; Refill: 0  2. Acute sinusitis, recurrence not specified, unspecified location Patient is afebrile, nontoxic in appearance.  Based on presentation, we jointly agreed we could treat  empirically for possible bacterial sinusitis on the right side.  Patient ;to start Augmentin, close vigilance advised; he will let me know how he is doing. - amoxicillin-clavulanate (AUGMENTIN) 875-125 MG tablet; Take 1 tablet by mouth 2 (two) times daily for 7 days.  Dispense: 14 tablet; Refill: 0    I am having Matthew Curry start on naproxen sodium, amoxicillin-clavulanate, and cyclobenzaprine. I am also having him maintain his cholecalciferol.   Meds ordered this encounter  Medications  . naproxen sodium (ANAPROX) 550 MG tablet    Sig: Take 1 tablet (550 mg total) by mouth daily as needed. May repeat dose in 12 hours if needed. Do not exceed two tablets in 24 hours. Take with food    Dispense:  30 tablet    Refill:  0    Order Specific Question:   Supervising Provider    Answer:   Duncan Dull L [[id number]]  . amoxicillin-clavulanate (AUGMENTIN) 875-125 MG tablet    Sig: Take 1 tablet by mouth 2 (two)  times daily for 7 days.    Dispense:  14 tablet    Refill:  0    Order Specific Question:   Supervising Provider    Answer:   Duncan Dull L [[id number]]  . cyclobenzaprine (FLEXERIL) 5 MG tablet    Sig: Take 1 tablet (5 mg total) by mouth at bedtime as needed for muscle spasms.    Dispense:  15 tablet    Refill:  0    Order Specific Question:   Supervising Provider    Answer:   Sherlene Shams [[id number]]    Return precautions given.   Risks, benefits, and alternatives of the medications and treatment plan prescribed today were discussed, and patient expressed understanding.   Education regarding symptom management and diagnosis given to patient on AVS.  Continue to follow with Glori Luis, MD for routine health maintenance.   Matthew Curry and I agreed with plan.   Rennie Plowman, FNP

## 2018-02-01 NOTE — Patient Instructions (Addendum)
Suspect that may have both muscle spasm on the right side and also possible sinusitis.  Will treat empirically for both as we  discussed at length today.  Start augmentin  Ensure to take probiotics while on antibiotics and also for 2 weeks after completion. It is important to re-colonize the gut with good bacteria and also to prevent any diarrheal infections associated with antibiotic use.  Flexeril ( muscle relaxant) at bedtime as needed.  Do not drive or operate heavy machinery while on muscle relaxant. Please do not drink alcohol. Only take this medication as needed for acute muscle spasm at bedtime. This medication make you feel drowsy so be very careful.  Stop taking if become too drowsy or somnolent as this puts you at risk for falls. Please contact our office with any questions.   Let us know if symptoms do not improve   Muscle Cramps and Spasms Muscle cramps and spasms occur when a muscle or muscles tighten and you have no control over this tightening (involuntary muscle contraction). They are a common problem and can develop in any muscle. The most common place is in the calf muscles of the leg. Muscle cramps and muscle spasms are both involuntary muscle contractions, but there are some differences between the two:  Muscle cramps are painful. They come and go and may last a few seconds to 15 minutes. Muscle cramps are often more forceful and last longer than muscle spasms.  Muscle spasms may or may not be painful. They may also last just a few seconds or much longer.  Certain medical conditions, such as diabetes or Parkinson disease, can make it more likely to develop cramps or spasms. However, cramps or spasms are usually not caused by a serious underlying problem. Common causes include:  Overexertion.  Overuse from repetitive motions, or doing the same thing over and over.  Remaining in a certain position for a long period of time.  Improper preparation, form, or technique while  playing a sport or doing an activity.  Dehydration.  Injury.  Side effects of some medicines.  Abnormally low levels of the salts and ions in your blood (electrolytes), especially potassium and calcium. This could happen if you are taking water pills (diuretics) or if you are pregnant.  In many cases, the cause of muscle cramps or spasms is unknown. Follow these instructions at home:  Stay well hydrated. Drink enough fluid to keep your urine clear or pale yellow.  Try massaging, stretching, and relaxing the affected muscle.  If directed, apply heat to tight or tense muscles as often as told by your health care provider. Use the heat source that your health care provider recommends, such as a moist heat pack or a heating pad. ? Place a towel between your skin and the heat source. ? Leave the heat on for 20-30 minutes. ? Remove the heat if your skin turns bright red. This is especially important if you are unable to feel pain, heat, or cold. You may have a greater risk of getting burned.  If directed, put ice on the affected area. This may help if you are sore or have pain after a cramp or spasm. ? Put ice in a plastic bag. ? Place a towel between your skin and the bag. ? Leavethe ice on for 20 minutes, 2-3 times a day.  Take over-the-counter and prescription medicines only as told by your health care provider.  Pay attention to any changes in your symptoms. Contact a health care  provider if:  Your cramps or spasms get more severe or happen more often.  Your cramps or spasms do not improve over time. This information is not intended to replace advice given to you by your health care provider. Make sure you discuss any questions you have with your health care provider. Document Released: 08/14/2001 Document Revised: 03/26/2015 Document Reviewed: 11/26/2014 Elsevier Interactive Patient Education  2018 [employer]

## 2018-02-08 ENCOUNTER — Ambulatory Visit: Payer: 59 | Admitting: Family Medicine

## 2018-02-08 DIAGNOSIS — Z0289 Encounter for other administrative examinations: Secondary | ICD-10-CM

## 2018-06-29 ENCOUNTER — Ambulatory Visit: Payer: Self-pay | Admitting: Family Medicine

## 2018-06-29 NOTE — Telephone Encounter (Signed)
Noted. Please follow-up with the patient tomorrow to see how the wound is doing.

## 2018-06-29 NOTE — Telephone Encounter (Signed)
Pt called in saying he hit his head on a nail an hour ago.   He was wondering when his last tetanus shot was.   I looked it up and it was 06/25/2016 per his chart.  See triage notes.    I went over the s/s/ to look for and go to the ED should they occur from the protocol.   He verbalized understanding.  He also inquired about getting a whooping cough shot because he has a new baby that is a 86 old and was told he needed to be vaccinated.      I contacted Dr. Purvis Sheffield office and spoke with Lupita Leash.   They do give the whooping cough shot at the office.   I warm transferred him to Lupita Leash to be scheduled  For the whooping cough shot  I let her know about the head injury and that per the protocol home care is all that is needed and that I went over the s/s/ to watch for and go to the ED should they occur.  I sent these triage notes to the office.   Reason for Disposition . Scalp swelling, bruise or pain  Answer Assessment - Initial Assessment Questions 1. MECHANISM: "How did the injury happen?" For falls, ask: "What height did you fall from?" and "What surface did you fall against?"      I was going into an attic and I stood up and I hit my head against a nail.   It bled a little.   I'm not sure when my last tetanus shot was.    It hit the back center of my head.   I think it just broke the skin but it didn't go in very deep if it did go in.   I had a co-worker look at it and he said it didn't look too bad.   I took a Tylenol and that made the headache go away. 2. ONSET: "When did the injury happen?" (Minutes or hours ago)      An hour ago. 3. NEUROLOGIC SYMPTOMS: "Was there any loss of consciousness?" "Are there any other neurological symptoms?"      No 4. MENTAL STATUS: "Does the person know who he is, who you are, and where he is?"      Yes 5. LOCATION: "What part of the head was hit?"      See above 6. SCALP APPEARANCE: "What does the scalp look like? Is it bleeding now?" If so, ask: "Is  it difficult to stop?"      It has stopped bleeding. 7. SIZE: For cuts, bruises, or swelling, ask: "How large is it?" (e.g., inches or centimeters)      A puncture 8. PAIN: "Is there any pain?" If so, ask: "How bad is it?"  (e.g., Scale 1-10; or mild, moderate, severe)     No pain now. 9. TETANUS: For any breaks in the skin, ask: "When was the last tetanus booster?"     Not sure   Reason I'm calling 10. OTHER SYMPTOMS: "Do you have any other symptoms?" (e.g., neck pain, vomiting)       No 11. PREGNANCY: "Is there any chance you are pregnant?" "When was your last menstrual period?"       N/A  Protocols used: HEAD INJURY-A-AH

## 2018-06-29 NOTE — Telephone Encounter (Addendum)
FYI: Patient was scheduled a nurse visit appt for Tdap on 07/04/18 @ 3:30 pm.

## 2018-06-30 NOTE — Telephone Encounter (Signed)
Called pt and left a detailed VM to call back.

## 2018-07-03 NOTE — Telephone Encounter (Signed)
Called pt and left a VM to call back. CRM created and sent to [facility] pool. 

## 2018-07-04 ENCOUNTER — Ambulatory Visit: Payer: 59

## 2018-07-04 ENCOUNTER — Other Ambulatory Visit: Payer: Self-pay

## 2018-07-05 NOTE — Telephone Encounter (Signed)
Pt called back and stated that his wound is healing fine and getting better.   Sent to PCP as an [place]

## 2018-07-05 NOTE — Telephone Encounter (Signed)
Called pt and left a VM to call back. CRM created and sent to [facility] pool. 

## 2018-07-05 NOTE — Telephone Encounter (Signed)
Sent pt a mychart message as well.  

## 2018-07-10 ENCOUNTER — Ambulatory Visit: Payer: Self-pay | Admitting: *Deleted

## 2018-07-10 NOTE — Telephone Encounter (Signed)
Pt reports co-worker has tested positive for Covid-19. No direct contact. Pt has had contact with other co-workers who had direct contact with positive co-worker. Pt is asymptomatic.States he has been taking all precautions, preventive measures,  as recommended.  Pt inquiring about testing. Protocol and criteria reviewed with patient. Advised to monitor temp, self isolate if symptoms present ,go to ED for SOB, severe symptoms. Pt verbalizes understanding. Advised to CB if any additional questions arise. Also advised to contact workplace for any further instructions. CB# [phone]  Reason for Disposition . COVID-19, questions about  Answer Assessment - Initial Assessment Questions 1. COVID-19 DIAGNOSIS: "Who made your Coronavirus (COVID-19) diagnosis?" "Was it confirmed by a positive lab test?" If not diagnosed by a HCP, ask "Are there lots of cases (community spread) where you live?" (See public health department website, if unsure)   * MAJOR community spread: high number of cases; numbers of cases are increasing; many people hospitalized.   * MINOR community spread: low number of cases; not increasing; few or no people hospitalized     N/A 2. ONSET: "When did the COVID-19 symptoms start?"      *No Answer* 3. WORST SYMPTOM: "What is your worst symptom?" (e.g., cough, fever, shortness of breath, muscle aches)     *No Answer* 4. COUGH: "Do you have a cough?" If so, ask: "How bad is the cough?"       *No Answer* 5. FEVER: "Do you have a fever?" If so, ask: "What is your temperature, how was it measured, and when did it start?"     *No Answer* 6. RESPIRATORY STATUS: "Describe your breathing?" (e.g., shortness of breath, wheezing, unable to speak)      *No Answer* 7. BETTER-SAME-WORSE: "Are you getting better, staying the same or getting worse compared to yesterday?"  If getting worse, ask, "In what way?"     *No Answer* 8. HIGH RISK DISEASE: "Do you have any chronic medical problems?" (e.g.,  asthma, heart or lung disease, weak immune system, etc.)     *No Answer* 9. PREGNANCY: "Is there any chance you are pregnant?" "When was your last menstrual period?"     *No Answer* 10. OTHER SYMPTOMS: "Do you have any other symptoms?"  (e.g., runny nose, headache, sore throat, loss of smell)       no symptoms  Protocols used: CORONAVIRUS (COVID-19) DIAGNOSED OR SUSPECTED-A-AH

## 2018-07-11 NOTE — Telephone Encounter (Signed)
Dr. Birdie Sons I am not sure if we need to keep track and reach out to patient everyday to check in or do when need to advise pt to go and be tested?

## 2018-07-11 NOTE — Telephone Encounter (Signed)
Please see my prior note. He does not need to be tested at this time.

## 2018-07-11 NOTE — Telephone Encounter (Signed)
At this time the only way to get tested is in the ED. I agree with the advice given to the patient by the RN. He should monitor for symptoms and if they occur he needs to self isolate and contact us for a virtual visit. If he develops severe symptoms of shortness of breath he needs to go to the ED.

## 2018-07-12 NOTE — Telephone Encounter (Signed)
Called and spoke with pt. He has had NO symptoms pt is still currently working and wearing a mask while working but pt has had no symptoms and his co-workers who have tested positive have been sent home to self isolate and other co-workers who have been exposed have been sent home two pt stated that he has had two ppl at his job test positive.   Pt advised if he develops any symptoms to call us to advise and if they get server he needs to go to the ED.

## 2018-07-12 NOTE — Telephone Encounter (Signed)
Called pt and left a VM to call me back on my direct number 

## 2018-08-21 ENCOUNTER — Telehealth: Payer: Self-pay | Admitting: Family Medicine

## 2018-08-21 NOTE — Telephone Encounter (Signed)
Pt requesting covid testing. States his mother in law tested positive, resulted today. States possible exposure. Reports mild sore throat onset last Thursday, afebrile.Concerned as he has newborn at home.  Pts ph # and email verified.  Please advise: [phone]

## 2018-08-22 NOTE — Telephone Encounter (Signed)
Patient called to advised of the request for covid testing. He says that he was tested yesterday at [facility] so that he could get the results back faster. I advised of the recommendation from Dr. Caryl Bis noted below, he verbalized understanding and says he's already on quarantine.

## 2018-08-22 NOTE — Telephone Encounter (Signed)
I am fine with the patient being tested given his positive exposure and sore throat. He is low risk for complications, though I think it would be a good idea for him to be tested given his exposure. He will need to be quarantined starting now at home and anyone else in the house should quarantined as well given the potential exposure and patients symptoms. This quarantine will need to be for at least 10 days from symptom onset with at least 3 days of no symptoms and no fever at the end of the 10 days. Please relay this information to the patient. We could also consider doing a virtual visit if needed if the patient develops additional symptoms. He should seek ED evaluation if he develops shortness of breath, chest pain, or signs of dehydration.

## 2018-08-22 NOTE — Telephone Encounter (Signed)
This encounter was created in error - please disregard.

## 2018-08-22 NOTE — Telephone Encounter (Signed)
Called pt to schedule Covid testing at [facility]. No answer, lvm for pt to return call for scheduling.

## 2018-08-23 NOTE — Telephone Encounter (Signed)
Noted.  Please follow-up with the patient to see how he is feeling and see if he is received his test results.  Thanks.

## 2018-08-24 NOTE — Telephone Encounter (Signed)
Called pt no answer.  LMTCB 

## 2018-08-24 NOTE — Telephone Encounter (Signed)
[facility] went to [facility] to be tested because our testing center was to far out but now waiting to be retested per pediatrician for daughter recommendation to morrow , patient initial test came back negative for COVID. Patient says he feels fine.

## 2018-08-25 NOTE — Telephone Encounter (Signed)
Noted.  Does he need anything additional from [place] at this time?  Thanks.

## 2018-08-28 NOTE — Telephone Encounter (Signed)
Not at this time will call when he receives second results.

## 2018-11-17 ENCOUNTER — Other Ambulatory Visit: Payer: Self-pay

## 2018-11-17 ENCOUNTER — Ambulatory Visit (INDEPENDENT_AMBULATORY_CARE_PROVIDER_SITE_OTHER): Payer: 59

## 2018-11-17 DIAGNOSIS — Z23 Encounter for immunization: Secondary | ICD-10-CM | POA: Diagnosis not present

## 2019-01-05 ENCOUNTER — Other Ambulatory Visit: Payer: Self-pay

## 2019-01-05 ENCOUNTER — Ambulatory Visit (INDEPENDENT_AMBULATORY_CARE_PROVIDER_SITE_OTHER): Payer: 59 | Admitting: Family Medicine

## 2019-01-05 VITALS — BP 118/60 | HR 60 | Temp 97.9°F | Ht 68.5 in | Wt 184.4 lb

## 2019-01-05 DIAGNOSIS — R2231 Localized swelling, mass and lump, right upper limb: Secondary | ICD-10-CM

## 2019-01-05 LAB — CBC WITH DIFFERENTIAL/PLATELET
Basophils Absolute: 0 10*3/uL (ref 0.0–0.1)
Basophils Relative: 0.5 % (ref 0.0–3.0)
Eosinophils Absolute: 0.1 10*3/uL (ref 0.0–0.7)
Eosinophils Relative: 3 % (ref 0.0–5.0)
HCT: 43.9 % (ref 39.0–52.0)
Hemoglobin: 15.6 g/dL (ref 13.0–17.0)
Lymphocytes Relative: 33.3 % (ref 12.0–46.0)
Lymphs Abs: 1.6 10*3/uL (ref 0.7–4.0)
MCHC: 35.6 g/dL (ref 30.0–36.0)
MCV: 86 fl (ref 78.0–100.0)
Monocytes Absolute: 0.5 10*3/uL (ref 0.1–1.0)
Monocytes Relative: 9.8 % (ref 3.0–12.0)
Neutro Abs: 2.5 10*3/uL (ref 1.4–7.7)
Neutrophils Relative %: 53.4 % (ref 43.0–77.0)
Platelets: 186 10*3/uL (ref 150.0–400.0)
RBC: 5.11 Mil/uL (ref 4.22–5.81)
RDW: 12.5 % (ref 11.5–15.5)
WBC: 4.7 10*3/uL (ref 4.0–10.5)

## 2019-01-05 MED ORDER — CEPHALEXIN 500 MG PO CAPS: 500.0000 mg | ORAL_CAPSULE | Freq: Two times a day (BID) | ORAL | 0 refills | Status: DC

## 2019-01-05 NOTE — Progress Notes (Signed)
Subjective:    Patient ID: Matthew Curry, male    DOB: 24-Feb-1991, 28 y.o.   MRN: [id number]  HPI   Patient presents to clinic due to lump in right axilla.  Patient states he did feel larger few days ago, but seems of gone down.  Patient states he has had lymph node swelling before and it neck area, but they usually go away on their own.  States he has had areas of left leg ingrown hairs before, but this does not seem like that.  Patient also reports history of lipomas.  Patient Active Problem List   Diagnosis Date Noted  . Tarsal tunnel syndrome 08/16/2017  . 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 RUQ pain 05/13/2017  . Elevated blood pressure reading 05/27/2016  . Low back pain 04/26/2016  . Encounter for general adult medical examination with abnormal findings 12/20/2014  . Generalized anxiety disorder 03/11/2014   Social History   Tobacco Use  . Smoking status: Former Games developer  . Smokeless tobacco: Never Used  . Tobacco comment: quit 1 year ago  Substance Use Topics  . Alcohol use: Yes    Comment: few beers/day     Review of Systems  Constitutional: Negative for chills, fatigue and fever.  HENT: Negative for congestion, ear pain, sinus pain and sore throat.   Eyes: Negative.   Respiratory: Negative for cough, shortness of breath and wheezing.   Cardiovascular: Negative for chest pain, palpitations and leg swelling.  Gastrointestinal: Negative for abdominal pain, diarrhea, nausea and vomiting.  Genitourinary: Negative for dysuria, frequency and urgency.  Musculoskeletal: Negative for arthralgias and myalgias.  Skin: ?lump right axilla Neurological: Negative for syncope, light-headedness and headaches.  Psychiatric/Behavioral: The patient is not nervous/anxious.       Objective:   Physical Exam Vitals signs and nursing note reviewed.  Constitutional:      General: He is not in acute distress.    Appearance: He is not toxic-appearing.  HENT:     Head:  Normocephalic and atraumatic.  Cardiovascular:     Rate and Rhythm: Normal rate and regular rhythm.     Heart sounds: Normal heart sounds.  Pulmonary:     Effort: Pulmonary effort is normal. No respiratory distress.     Breath sounds: Normal breath sounds.  Chest:     Comments: Small lump palpated; approximately size of a marble.  Unsure if it is a lymph node or may be a small cyst.  Skin is not hot or red.  Area is not draining. Skin:    General: Skin is warm and dry.     Coloration: Skin is not jaundiced or pale.     Findings: No erythema.  Neurological:     Mental Status: He is alert.     Gait: Gait normal.  Psychiatric:        Mood and Affect: Mood normal.        Behavior: Behavior normal.     Today's Vitals   01/05/19 1349  BP: 118/60  Pulse: 60  Temp: 97.9 F (36.6 C)  TempSrc: Temporal  SpO2: 97%  Weight: 184 lb 6.4 oz (83.6 kg)  Height: 5' 8.5" (1.74 m)   Body mass index is 27.63 kg/m.     Assessment & Plan:    Lump right axilla - we will get CBC to further investigate possibility of area being a swollen lymph node.  He will also take course of Keflex twice daily for 10 days to cover for possibility  of an infected/inflamed cyst.  If CBC is unremarkable and diarrhea continues to persist even after antibiotic treatment next 5 days to get an ultrasound of the axilla area.  Patient will be made aware of lab results and available.  He will let us know if area worsens or any other symptoms develop.

## 2019-03-29 ENCOUNTER — Telehealth: Payer: Self-pay | Admitting: Family Medicine

## 2019-03-29 NOTE — Telephone Encounter (Signed)
Pt called about his daughter waking up this morning sick she was tested for covid it was neg. Pt got tested first test showed + and the second test showed neg. Pt has a sore throat and wants to know if he should quarantine? Please advise and Thank you!  Call pt @ [phone].

## 2019-03-30 ENCOUNTER — Ambulatory Visit
Admission: EM | Admit: 2019-03-30 | Discharge: 2019-03-30 | Disposition: A | Discharge status: Home / Self Care | Payer: Managed Care, Other (non HMO) | Attending: Emergency Medicine | Admitting: Emergency Medicine

## 2019-03-30 DIAGNOSIS — R6883 Chills (without fever): Secondary | ICD-10-CM

## 2019-03-30 DIAGNOSIS — R519 Headache, unspecified: Secondary | ICD-10-CM | POA: Diagnosis not present

## 2019-03-30 DIAGNOSIS — B349 Viral infection, unspecified: Secondary | ICD-10-CM

## 2019-03-30 DIAGNOSIS — R5383 Other fatigue: Secondary | ICD-10-CM | POA: Diagnosis not present

## 2019-03-30 LAB — POCT INFLUENZA A/B
Influenza A, POC: NEGATIVE
Influenza B, POC: NEGATIVE

## 2019-03-30 NOTE — Telephone Encounter (Signed)
Noted  

## 2019-03-30 NOTE — ED Triage Notes (Signed)
Pt presents with headache that is frontal and pressure like in nature. Patient also complains of sore throat and runny nose. Pt was seen at another urgent care yesterday and tested positive for flu B. He tested negative on a rapid COVID test. The provider there told him that he should be retested for COVID at a later date. The patient is requesting a send out test for COVID.

## 2019-03-30 NOTE — Telephone Encounter (Signed)
Please call the patient and clarify if he tested positive.  Based on the message it sounds as though he tested positive on the first test and then tested negative on the second test.  If he has symptoms he needs to quarantine regardless of the test results though if he has not personally been tested he will need to be tested given his symptoms.

## 2019-03-30 NOTE — Discharge Instructions (Addendum)
Your COVID test is pending.  You should self quarantine until your test result is back and is negative.    Take Tylenol as needed for fever or discomfort.  Rest and keep yourself hydrated with 8-10 glasses of water each day.    Go to the emergency department if you develop high fever, shortness of breath, severe diarrhea, or other concerning symptoms.    

## 2019-03-30 NOTE — ED Provider Notes (Signed)
Renaldo Fiddler    CSN: [id number] Arrival date & time: 03/30/19  1054      History   Chief Complaint Chief Complaint  Patient presents with  . Headache    HPI Matthew Curry is a 29 y.o. male.   Reports that he has been having headaches, fatigue, post nasal drip, chills x 3 days. Denies fever, body aches, nausea, vomiting, diarrhea, shortness of breath. Reports that he was told yesterday at his PCP that his flu B test was positive, rapid COVID was negative. Reports that he was told that he may actually have COVID and not the flu. Reports that he would like to "really know what is going on." Requesting that we do send out COVID testing and requesting another rapid flu test.  The history is provided by the patient.  Headache Associated symptoms: cough, drainage, fatigue, myalgias and sore throat   Associated symptoms: no abdominal pain, no back pain, no diarrhea, no ear pain, no eye pain, no fever, no nausea, no seizures and no vomiting     Past Medical History:  Diagnosis Date  . Anxiety   . Chest tightness or pressure   . GERD (gastroesophageal reflux disease)   . History of narcotic addiction (HCC)    Previously on Suboxone, followed in [place]  . Narcotic withdrawal (HCC) 12/20/2014  . OCD (obsessive compulsive disorder)     Patient Active Problem List   Diagnosis Date Noted  . Tarsal tunnel syndrome 08/16/2017  . 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 RUQ pain 05/13/2017  . Elevated blood pressure reading 05/27/2016  . Low back pain 04/26/2016  . Encounter for general adult medical examination with abnormal findings 12/20/2014  . Generalized anxiety disorder 03/11/2014    Past Surgical History:  Procedure Laterality Date  . ADENOIDECTOMY  1998       Home Medications    Prior to Admission medications   Medication Sig Start Date End Date Taking? Authorizing Provider  cephALEXin (KEFLEX) 500 MG capsule Take 1 capsule (500 mg total) by  mouth 2 (two) times daily. 01/05/19   Tracey Harries, FNP  cholecalciferol (VITAMIN D) 1000 units tablet Take by mouth daily.     [provider]  cyclobenzaprine (FLEXERIL) 5 MG tablet Take 1 tablet (5 mg total) by mouth at bedtime as needed for muscle spasms. Patient not taking: Reported on 01/05/2019 02/01/18   Allegra Grana, FNP  Multiple Vitamins-Minerals (MULTIVITAMIN ADULT PO) Take by mouth daily.    [provider]  naproxen sodium (ANAPROX) 550 MG tablet Take 1 tablet (550 mg total) by mouth daily as needed. May repeat dose in 12 hours if needed. Do not exceed two tablets in 24 hours. Take with food Patient not taking: Reported on 01/05/2019 02/01/18   Allegra Grana, FNP  NON FORMULARY Take by mouth daily. Calcium    [provider]  Vitamin D, Ergocalciferol, 50 MCG (2000 UT) CAPS Take 2,000 Units by mouth daily.    [provider]    Family History Family History  Problem Relation Age of Onset  . Coronary artery disease Father        several of Pt family members(not mother or sibilings) have "leak valves"  . Diabetes Father     Social History Social History   Tobacco Use  . Smoking status: Former Games developer  . Smokeless tobacco: Never Used  . Tobacco comment: quit 1 year ago  Substance Use Topics  . Alcohol use: Yes  Comment: few beers/day  . Drug use: Yes     Allergies   Sulfonamide derivatives and Sulfadiazine   Review of Systems Review of Systems  Constitutional: Positive for chills and fatigue. Negative for fever.  HENT: Positive for postnasal drip and sore throat. Negative for ear pain.   Eyes: Negative for pain and visual disturbance.  Respiratory: Positive for cough. Negative for shortness of breath.   Cardiovascular: Negative for chest pain and palpitations.  Gastrointestinal: Negative for abdominal pain, diarrhea, nausea and vomiting.  Genitourinary: Negative for dysuria and hematuria.  Musculoskeletal:  Positive for myalgias. Negative for arthralgias and back pain.  Skin: Negative for color change and rash.  Neurological: Positive for headaches. Negative for seizures and syncope.  All other systems reviewed and are negative.    Physical Exam Triage Vital Signs ED Triage Vitals  Enc Vitals Group     BP 03/30/19 1056 128/74     Pulse Rate 03/30/19 1056 80     Resp 03/30/19 1056 18     Temp 03/30/19 1056 97.7 F (36.5 C)     Temp src --      SpO2 03/30/19 1056 98 %     Weight --      Height --      Head Circumference --      Peak Flow --      Pain Score 03/30/19 1100 4     Pain Loc --      Pain Edu? --      Excl. in GC? --    No data found.  Updated Vital Signs BP 128/74   Pulse 80   Temp 97.7 F (36.5 C)   Resp 18   SpO2 98%   Physical Exam Vitals and nursing note reviewed.  Constitutional:      Appearance: He is well-developed. He is ill-appearing.  HENT:     Head: Normocephalic and atraumatic.     Right Ear: Tympanic membrane and ear canal normal.     Left Ear: Tympanic membrane and ear canal normal.     Mouth/Throat:     Mouth: Mucous membranes are moist.  Eyes:     Extraocular Movements: Extraocular movements intact.     Conjunctiva/sclera: Conjunctivae normal.     Pupils: Pupils are equal, round, and reactive to light.  Cardiovascular:     Rate and Rhythm: Normal rate and regular rhythm.     Heart sounds: No murmur.  Pulmonary:     Effort: Pulmonary effort is normal. No respiratory distress.     Breath sounds: Normal breath sounds. No wheezing or rhonchi.  Abdominal:     Palpations: Abdomen is soft.     Tenderness: There is no abdominal tenderness.  Musculoskeletal:     Cervical back: Neck supple.  Skin:    General: Skin is warm and dry.  Neurological:     Mental Status: He is alert.     Sensory: No sensory deficit.  Psychiatric:        Mood and Affect: Mood normal.        Speech: Speech normal.        Behavior: Behavior normal.      UC  Treatments / Results  Labs (all labs ordered are listed, but only abnormal results are displayed) Labs Reviewed  NOVEL CORONAVIRUS, NAA  POCT INFLUENZA A/B    EKG   Radiology No results found.  Procedures Procedures (including critical care time)  Medications Ordered in UC Medications - No data to  display  Initial Impression / Assessment and Plan / UC Course  I have reviewed the triage vital signs and the nursing notes.  Pertinent labs & imaging results that were available during my care of the patient were reviewed by me and considered in my medical decision making (see chart for details).  Clinical Course as of Mar 30 1143  Fri Mar 30, 2019  1131 POCT Influenza A/B [SM]    Clinical Course User Index [SM] Faustino Congress, NP    Rapid flu negative. Send out COVID pending. Instructed to self quarantine until results come back negative. Instructed to stay well hydrated, take Tylenol for fever, headaches, body aches, as needed. Will notify patient of COVID results when they come in. See primary care provider as needed.  Final Clinical Impressions(s) / UC Diagnoses   Final diagnoses:  Fatigue, unspecified type  Chills (without fever)  Nonintractable episodic headache, unspecified headache type  Viral illness     Discharge Instructions     Your COVID test is pending.  You should self quarantine until your test result is back and is negative.    Take Tylenol as needed for fever or discomfort.  Rest and keep yourself hydrated with 8-10 glasses of water each day.    Go to the emergency department if you develop high fever, shortness of breath, severe diarrhea, or other concerning symptoms.       ED Prescriptions    None     PDMP not reviewed this encounter.   Faustino Congress, NP 03/30/19 1145

## 2019-03-30 NOTE — Telephone Encounter (Signed)
Pt called about his daughter waking up this morning sick she was tested for covid it was neg. Pt got tested first test showed + and the second test showed neg. Pt has a sore throat and wants to know if he should quarantine? Please advise and Thank you!  Call pt @ [phone]. 

## 2019-03-30 NOTE — Telephone Encounter (Signed)
I called and spoke with the patient and he stated that he decided to go to the [place] urgent care across the street from [place] and they did the covid test and the flu test. The flu test was negative and they sent his covid test to [facility] and I informed him that he was to quarantine until his results come back and he here from the health dept.  He stated that the first 2 test were the rapid test. He should know the results by Monday.  Jerelene Salaam,cma

## 2019-04-01 LAB — NOVEL CORONAVIRUS, NAA: SARS-CoV-2, NAA: NOT DETECTED

## 2019-04-21 ENCOUNTER — Ambulatory Visit: Payer: 59 | Attending: Internal Medicine

## 2019-04-21 DIAGNOSIS — Z23 Encounter for immunization: Secondary | ICD-10-CM

## 2019-04-21 NOTE — Progress Notes (Signed)
   Covid-19 Vaccination Clinic  Name:  Lathon Adan    MRN: [id number] DOB: 09-Nov-1990  04/21/2019  Mr. Whisenant was observed post Covid-19 immunization for 15 minutes without incidence. He was provided with Vaccine Information Sheet and instruction to access the V-Safe system.   Mr. Asch was instructed to call 911 with any severe reactions post vaccine: Marland Kitchen Difficulty breathing  . Swelling of your face and throat  . A fast heartbeat  . A bad rash all over your body  . Dizziness and weakness    Immunizations Administered    Name Date Dose VIS Date Route   Pfizer COVID-19 Vaccine 04/21/2019  2:17 PM 0.3 mL 02/16/2019 Intramuscular   Manufacturer: [employer], [employer]   Lot: [id number]   NDC: [address]-9

## 2019-05-14 ENCOUNTER — Ambulatory Visit: Payer: 59 | Attending: Internal Medicine

## 2019-05-14 DIAGNOSIS — Z23 Encounter for immunization: Secondary | ICD-10-CM | POA: Insufficient documentation

## 2019-05-14 NOTE — Progress Notes (Signed)
   Covid-19 Vaccination Clinic  Name:  Matthew Curry    MRN: [id number] DOB: 1991-01-02  05/14/2019  [name] was observed post Covid-19 immunization for 15 minutes without incident. He was provided with Vaccine Information Sheet and instruction to access the V-Safe system.   [name] was instructed to call 911 with any severe reactions post vaccine: Marland Kitchen Difficulty breathing  . Swelling of face and throat  . A fast heartbeat  . A bad rash all over body  . Dizziness and weakness   Immunizations Administered    Name Date Dose VIS Date Route   Pfizer COVID-19 Vaccine 05/14/2019 12:13 PM 0.3 mL 02/16/2019 Intramuscular   Manufacturer: [employer], [employer]   Lot: [id number]   NDC: [address]-6

## 2020-03-17 ENCOUNTER — Telehealth: Payer: Self-pay | Admitting: Family Medicine

## 2020-03-17 NOTE — Telephone Encounter (Signed)
Patient called in wanted to know if Dr.Sonnenberg could write a prescription for Paxlovid for the [facility] [place] patient tested positive for covid.  Terrian Sentell,cma

## 2020-03-17 NOTE — Telephone Encounter (Signed)
Patient called in wanted to know if Dr.Sonnenberg could write a prescription for Paxlovid for the [facility] in [place] patient tested positive for covid

## 2020-03-17 NOTE — Telephone Encounter (Signed)
I have not seen this patient in almost 2 years and thus may not have an up to date medical history. Please find out if he has developed any new medical issues. Based on his prior history he does not meet criteria for use of paxlovid. This medication is for patients with mild to moderate symptoms that are at high risk for progression to severe COVID19. He did not previously have any high risk conditions. He should remain isolated at home. I have included isolation guidelines below for the patient. Please see what symptoms he currently has.  1. If you tested positive:  Everyone, regardless of vaccination status.  Stay home for 5 days. If you have no symptoms or your symptoms are resolving after 5 days, you can leave your house. If you continue to have symptoms you need to remain isolated at home until your symptoms are resolving.  Continue to wear a mask around others for 5 additional days. If you have a fever, continue to stay home until your fever resolves.

## 2020-03-18 ENCOUNTER — Other Ambulatory Visit: Payer: 59

## 2020-03-18 DIAGNOSIS — Z20822 Contact with and (suspected) exposure to covid-19: Secondary | ICD-10-CM

## 2020-03-18 NOTE — Telephone Encounter (Signed)
LVM for the patient to call back for information from the provider. Imanni Burdine,cma

## 2020-03-18 NOTE — Telephone Encounter (Signed)
I called and spoke with the patient and informed him that the medication he is speaking of for covid is for very high risk patients.  He stated he tested positive with a at home test but he is having no symptoms. I read the guidelines to the patient and he understood.  Trevor Wilkie,cma

## 2020-03-20 LAB — SARS-COV-2, NAA 2 DAY TAT

## 2020-03-20 LAB — NOVEL CORONAVIRUS, NAA: SARS-CoV-2, NAA: NOT DETECTED

## 2020-03-22 ENCOUNTER — Other Ambulatory Visit: Payer: 59

## 2020-12-18 ENCOUNTER — Telehealth: Payer: Self-pay | Admitting: Family Medicine

## 2020-12-18 NOTE — Telephone Encounter (Signed)
Patient stated that he received his prescription from a Telehealth appointment and only called because the pressure persisted. Pt will call back if symptoms continue.

## 2020-12-18 NOTE — Telephone Encounter (Signed)
Called to speak with [facility]. Matthew Curry states that he has already received care advice from Access Nurse and plans on waiting a few more days to see if his symptoms resolve. Pt states that he still has his steroid prescription that he has not completed and he will continue to take that medication. Pt states that the pain has gone away and that he only has the blockage currently. Pt was instructed by Access Nurse to try a saline rinse and afrin to try to clear his sinuses. Pt agreed and wants to wait until Monday to see if he needs further assistance.

## 2020-12-18 NOTE — Telephone Encounter (Signed)
Reviewed.  Note states taking steroid prescription that he was given.  I am not sure who prescribed.  Was he evaluated for this?  Agree with completing medication.  If persistent pain in ear, needs to be evaluated and would hold on putting anything in the ear until evaluated.

## 2020-12-18 NOTE — Telephone Encounter (Signed)
Patient informed, Due to the high volume of calls and your symptoms we have to forward your call to our Triage Nurse to expedient your call. Please hold for the transfer.  Patient transferred to [facility] at Access Nurse. Due to having left ear pain and pressure since Sunday after recently flying.He was given drops to help with symptoms at a telehealth visit but he is still having the pressure in his ear.No openings in office or virtual for the patient to be evaluated.

## 2020-12-18 NOTE — Telephone Encounter (Signed)
Providing access nurse documentation.      

## 2021-01-09 ENCOUNTER — Telehealth: Payer: Self-pay | Admitting: Family Medicine

## 2021-01-09 NOTE — Telephone Encounter (Signed)
FYI [facility] please call and triage patient.  Does he feels lightheaded with changes positions such as sitting to standing.  Is he drinking plenty of water?  Has he had any chest pain, syncopal episodes, palpitations, headache or vision changes?   He is not on any identifiable medications that would make his blood pressure low.   likely be orthostatic hypotension which occurs when blood pressure decreases based on position changes. It is most important to be careful when changing positions and take your time as well as staying adequately hydrated.  However in saying the above, if this symptom were to persist , worsen or he would have associated symptoms including chest pain, syncopal episodes, palpitations, headache or vision changes, he would need to go UC or ED today for in person evaluation. Otherwise you may scheduled f/u appt with [place] to discuss in more detail if non acute.

## 2021-01-09 NOTE — Telephone Encounter (Signed)
Pt called in complaining that he is getting light headed and dizzy after working out. Pt stated that when he takes BP at night it seems very low. Pt stated his BP is around 120-115/62-67. Pt stated he thinks by his BP being low that's what's causing him to become light headed and dizzy.

## 2021-01-09 NOTE — Telephone Encounter (Signed)
I called and LVM for patient to call back.  Gaylyn Berish,cma  

## 2021-01-09 NOTE — Telephone Encounter (Signed)
Pt called in complaining that he is getting light headed and dizzy after working out. Pt stated that when he takes BP at night it seems very low. Pt stated his BP is around 120-115/62-67. Pt stated he thinks by his BP being low that's what's causing him to become light headed and dizzy.  Tarsha Blando,cma

## 2021-01-11 NOTE — Telephone Encounter (Signed)
Please follow-up with the patient regarding the message Matthew Curry previously sent.  You can send the follow-up message to me.

## 2021-01-12 ENCOUNTER — Ambulatory Visit: Payer: Managed Care, Other (non HMO) | Admitting: Family

## 2021-01-12 ENCOUNTER — Encounter: Payer: Self-pay | Admitting: Family

## 2021-01-12 ENCOUNTER — Other Ambulatory Visit: Payer: Self-pay

## 2021-01-12 VITALS — BP 145/80 | HR 77 | Temp 98.4°F | Ht 68.5 in | Wt 190.6 lb

## 2021-01-12 DIAGNOSIS — R42 Dizziness and giddiness: Secondary | ICD-10-CM | POA: Insufficient documentation

## 2021-01-12 DIAGNOSIS — F411 Generalized anxiety disorder: Secondary | ICD-10-CM

## 2021-01-12 DIAGNOSIS — Z7689 Persons encountering health services in other specified circumstances: Secondary | ICD-10-CM | POA: Insufficient documentation

## 2021-01-12 DIAGNOSIS — R030 Elevated blood-pressure reading, without diagnosis of hypertension: Secondary | ICD-10-CM

## 2021-01-12 LAB — COMPREHENSIVE METABOLIC PANEL
ALT: 22 U/L (ref 0–53)
AST: 18 U/L (ref 0–37)
Albumin: 4.4 g/dL (ref 3.5–5.2)
Alkaline Phosphatase: 43 U/L (ref 39–117)
BUN: 18 mg/dL (ref 6–23)
CO2: 28 mEq/L (ref 19–32)
Calcium: 9.3 mg/dL (ref 8.4–10.5)
Chloride: 105 mEq/L (ref 96–112)
Creatinine, Ser: 0.94 mg/dL (ref 0.40–1.50)
GFR: 108.63 mL/min (ref >60.00)
Glucose, Bld: 82 mg/dL (ref 70–99)
Potassium: 4.4 mEq/L (ref 3.5–5.1)
Sodium: 139 mEq/L (ref 135–145)
Total Bilirubin: 0.5 mg/dL (ref 0.2–1.2)
Total Protein: 6.6 g/dL (ref 6.0–8.3)

## 2021-01-12 NOTE — Assessment & Plan Note (Addendum)
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 Has not sought counseling or medication. Prefers to handle with exercise for now.

## 2021-01-12 NOTE — Assessment & Plan Note (Addendum)
Drinks an energy drink with workouts, takes creatinine and protein shakes. Stopped the energy drinks, but still feels the dizziness, can be sitting, mostly describes it when he stands up. Checking CMP, advised on drinking water throughout the day and not skipping meals. Check BP if able when having sx and call office if elevated.

## 2021-01-12 NOTE — Progress Notes (Signed)
Subjective:     Patient ID: Matthew Curry, male    DOB: 1990/03/17, 30 y.o.   MRN: [id number]  Chief Complaint  Patient presents with   Dizziness    Started 2 weeks ago. Pt noticed when lifting weights. He says he might have pushed himself too much. He began to feel better overtime, but has not subsided. He has recently stopped caffeine.     HPI  Anxiety/Depression: Patient complains of anxiety disorder.   He has the following symptoms: dizziness.  Onset of symptoms was approximately 2 years ago, He denies current suicidal and homicidal ideation.  Possible organic causes contributing are: none.  Risk factors: none  Previous treatment includes counseling, but pt refused.  Dizziness  He reports new onset dizziness. He describes it as feeling light headed and feeling like room is spinning, occurs intermittently, and typically lasts 38mn-1hour.  It typically occurs when he is sitting still and standing up from siting position. It is usually relieved by lying down and eating something. He has not started new medications around the time the dizziness started.  Associated symptoms: No hearing loss No tinnitus  No chest discomfort No heart palpitations  No heart racing No numbness or tingling of extremities  No nausea No vomiting  No speech difficulty No visual changes    ---------------------------------------------------------------------------------------------------  Health Maintenance Due  Topic Date Due   Hepatitis C Screening  Never done    Past Medical History:  Diagnosis Date   Anxiety    Chest tightness or pressure    GERD (gastroesophageal reflux disease)    History of narcotic addiction ([facility]    Previously on Suboxone, [place] in [place]  Narcotic withdrawal ([facility] 12/20/2014   OCD (obsessive compulsive disorder)     Past Surgical History:  Procedure Laterality Date   ADENOIDECTOMY  1998    Outpatient Medications Prior to Visit  Medication Sig  Dispense Refill   cholecalciferol (VITAMIN D) 1000 units tablet Take by mouth daily.  (Patient not taking: Reported on 01/12/2021)     NON FORMULARY Take by mouth daily. Calcium (Patient not taking: Reported on 01/12/2021)     cephALEXin (KEFLEX) 500 MG capsule Take 1 capsule (500 mg total) by mouth 2 (two) times daily. (Patient not taking: Reported on 01/12/2021) 20 capsule 0   cyclobenzaprine (FLEXERIL) 5 MG tablet Take 1 tablet (5 mg total) by mouth at bedtime as needed for muscle spasms. (Patient not taking: No sig reported) 15 tablet 0   Multiple Vitamins-Minerals (MULTIVITAMIN ADULT PO) Take by mouth daily. (Patient not taking: Reported on 01/12/2021)     naproxen sodium (ANAPROX) 550 MG tablet Take 1 tablet (550 mg total) by mouth daily as needed. May repeat dose in 12 hours if needed. Do not exceed two tablets in 24 hours. Take with food (Patient not taking: No sig reported) 30 tablet 0   Vitamin D, Ergocalciferol, 50 MCG (2000 UT) CAPS Take 2,000 Units by mouth daily. (Patient not taking: Reported on 01/12/2021)     No facility-administered medications prior to visit.    Allergies  Allergen Reactions   Sulfonamide Derivatives    Sulfadiazine Rash        Objective:    Physical Exam Vitals and nursing note reviewed.  Constitutional:      General: He is not in acute distress.    Appearance: Normal appearance.  HENT:     Head: Normocephalic.  Cardiovascular:     Rate and Rhythm: Normal rate and regular  rhythm.  Pulmonary:     Effort: Pulmonary effort is normal.     Breath sounds: Normal breath sounds.  Musculoskeletal:        General: Normal range of motion.     Cervical back: Normal range of motion.  Skin:    General: Skin is warm and dry.  Neurological:     Mental Status: He is alert and oriented to person, place, and time.  Psychiatric:        Mood and Affect: Mood normal.   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 Has not sought counseling or medication. Prefers to handle with exercise for now.      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 States BP good when checking at home. Having sx of intermittent dizziness, occ. HA today, advised on strategies to alleviate sx, but if still having, he should try to check BP at that moment and call the office if elevated.       Dizziness - Primary    Drinks an energy drink with workouts, takes creatinine and protein shakes. Stopped the energy drinks, but still feels the dizziness, can be sitting, mostly describes it when he stands up. Checking CMP, advised on drinking water throughout the day and not skipping meals. Check BP if able when having sx and call office if elevated.      Relevant Orders   Comp Met (CMET)   Encounter to establish care with new doctor   Time spent with patient today was 31 minutes which consisted of chart review, discussing diagnoses, work up, treatment answering questions, and documentation.   No orders of the defined types were placed in this encounter.

## 2021-01-12 NOTE — Assessment & Plan Note (Signed)
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 States BP good when checking at home. Having sx of intermittent dizziness, occ. HA today, advised on strategies to alleviate sx, but if still having, he should try to check BP at that moment and call the office if elevated.

## 2021-01-12 NOTE — Telephone Encounter (Signed)
Noted. Patient completed evaluation for this today.

## 2021-01-12 NOTE — Telephone Encounter (Signed)
I called and spoke with the patient and he stated he did see a provider this morning about this and the note should be in the chart it was a [place] provider.  See the note. Shaughnessy Gethers,cma

## 2021-01-12 NOTE — Patient Instructions (Signed)
It was very nice to see you today!  Go to the lab for blood work today, I will review the results via MyChart. As discussed, drink 64oz of water throughout the day as well as eating and not skipping meals. If having dizziness or headache, check your blood pressure. Call your PCP office if >130/90 consistently, or any 1 reading >140/90.  Please review the handout on dizziness for more info.   PLEASE NOTE:  If you had any lab tests please let us know if you have not heard back within a few days. You may see your results on mychart before we have a chance to review them but we will give you a call once they are reviewed by [place]. If we ordered any referrals today, please let us know if you have not heard from their office within the next week.   Please try these tips to maintain a healthy lifestyle:  Eat most of your calories during the day when you are active. Eliminate processed foods including packaged sweets (pies, cakes, cookies), reduce intake of potatoes, white bread, white pasta, and white rice. Look for whole grain options, oat flour or almond flour.  Each meal should contain half fruits/vegetables, one quarter protein, and one quarter carbs (no bigger than a computer mouse).  Cut down on sweet beverages. This includes juice, soda, and sweet tea. Also watch fruit intake, though this is a healthier sweet option, it still contains natural sugar! Limit to 3 servings daily.  Drink at least 1 glass of water with each meal and aim for at least 8 glasses per day  Exercise at least 150 minutes every week.

## 2021-02-18 ENCOUNTER — Other Ambulatory Visit: Payer: Self-pay

## 2021-02-18 ENCOUNTER — Ambulatory Visit (INDEPENDENT_AMBULATORY_CARE_PROVIDER_SITE_OTHER): Payer: Managed Care, Other (non HMO) | Admitting: Family Medicine

## 2021-02-18 ENCOUNTER — Encounter: Payer: Self-pay | Admitting: Family Medicine

## 2021-02-18 VITALS — BP 130/70 | HR 65 | Temp 98.0°F | Ht 68.5 in | Wt 190.6 lb

## 2021-02-18 DIAGNOSIS — Z13220 Encounter for screening for lipoid disorders: Secondary | ICD-10-CM

## 2021-02-18 DIAGNOSIS — R29898 Other symptoms and signs involving the musculoskeletal system: Secondary | ICD-10-CM

## 2021-02-18 DIAGNOSIS — Z0001 Encounter for general adult medical examination with abnormal findings: Secondary | ICD-10-CM | POA: Diagnosis not present

## 2021-02-18 DIAGNOSIS — R42 Dizziness and giddiness: Secondary | ICD-10-CM | POA: Diagnosis not present

## 2021-02-18 DIAGNOSIS — E663 Overweight: Secondary | ICD-10-CM | POA: Diagnosis not present

## 2021-02-18 DIAGNOSIS — Z1159 Encounter for screening for other viral diseases: Secondary | ICD-10-CM

## 2021-02-18 NOTE — Assessment & Plan Note (Signed)
Physical exam completed.  I encouraged healthy diet and exercise.  Encouraged continued reduction in caffeine intake.  I encouraged him to get the updated COVID booster.  I encouraged him to see an eye doctor periodically.  Lab work as outlined today.

## 2021-02-18 NOTE — Assessment & Plan Note (Signed)
Significant improvement with cutting out caffeine and increasing water intake prior to exercise.  He will continue with those interventions and continue to monitor.  If it worsens he will let us know.

## 2021-02-18 NOTE — Assessment & Plan Note (Signed)
He will call sports medicine to set up an appointment.

## 2021-02-18 NOTE — Progress Notes (Signed)
Matthew AlarEric Aleyna Cueva, MD Phone: [phone]  Matthew Curry is a 30 y.o. male who presents today for CPE.  Diet: eating more real food, generally healthy, has cut down on caffeine signficantly Exercise: weights, cardio 4-5x/week Colonoscopy: not indicated Prostate cancer screening: not indicated Family history-  Prostate cancer: no  Colon cancer: no Vaccines-   Flu: 3 weeks ago  Tetanus: UTD  COVID19: x2 HIV screening: UTD Hep C Screening: due Tobacco use: no Alcohol use: rare Illicit Drug use: no Dentist: yes Ophthalmology: periodically  Lightheadedness: Patient notes he had some lightheadedness when working out.  He saw a nurse practitioner at one of our other offices and they did lab work that was reassuring.  He has cut out caffeine and increased his water intake before exercise.  He notes no syncope or chest pain.  No shortness of breath.  He notes the lightheadedness has improved significantly in the last several weeks.  Right elbow popping: Patient notes this has been going on for several years.  Anytime he lifts heavy or does push-ups he hears a popping sound in his right elbow.  There is no associated pain.  He has seen sports medicine for this previously.   Active Ambulatory Problems    Diagnosis Date Noted   Generalized anxiety disorder 03/11/2014   Encounter for general adult medical examination with abnormal findings 12/20/2014   Low back pain 04/26/2016   Elevated blood pressure reading 05/27/2016   Focal axillary diaphoresis 05/13/2017   RUQ pain 05/13/2017   Tarsal tunnel syndrome 08/16/2017   Lightheadedness 01/12/2021   Encounter to establish care with new doctor 01/12/2021   Elbow clicking 02/18/2021   Resolved Ambulatory Problems    Diagnosis Date Noted   ANXIETY 07/15/2008   Obsessive-compulsive disorders 07/15/2008   GERD 07/15/2008   CHEST PAIN-UNSPECIFIED 07/15/2008   Shortness of breath 03/11/2014   Skin nodule 03/11/2014   Mouth ulcer  08/12/2014   Narcotic withdrawal (HCC) 12/20/2014   Enlarged lymph node 04/01/2015   Skin lesion 05/27/2016   Sore throat 05/27/2016   Past Medical History:  Diagnosis Date   Anxiety    Chest tightness or pressure    GERD (gastroesophageal reflux disease)    History of narcotic addiction (HCC)    OCD (obsessive compulsive disorder)     Family History  Problem Relation Age of Onset   Coronary artery disease Father        several of Pt family members(not mother or sibilings) have "leak valves"   Diabetes Father     Social History   Socioeconomic History   Marital status: Single    Spouse name: Not on file   Number of children: Not on file   Years of education: Not on file   Highest education level: Not on file  Occupational History   Not on file  Tobacco Use   Smoking status: Former   Smokeless tobacco: Never   Tobacco comments:    quit 1 year ago  Substance and Sexual Activity   Alcohol use: Yes    Comment: few beers/day   Drug use: Yes   Sexual activity: Not on file  Other Topics Concern   Not on file  Social History Narrative   [place] in [place]. No children. Has 2 dogs in home.      Work - [employer]      Diet - regular      Exercise - Runs about 1x per week   Social  Determinants of Health   Financial Resource Strain: Not on file  Food Insecurity: Not on file  Transportation Needs: Not on file  Physical Activity: Not on file  Stress: Not on file  Social Connections: Not on file  Intimate Partner Violence: Not on file    ROS  General:  Negative for nexplained weight loss, fever Skin: Negative for new or changing mole, sore that won't heal HEENT: Negative for trouble hearing, trouble seeing, ringing in ears, mouth sores, hoarseness, change in voice, dysphagia. CV:  Negative for chest pain, dyspnea, edema, palpitations Resp: Negative for cough, dyspnea, hemoptysis GI: Negative for nausea, vomiting, diarrhea,  constipation, abdominal pain, melena, hematochezia. GU: Negative for dysuria, incontinence, urinary hesitance, hematuria, vaginal or penile discharge, polyuria, sexual difficulty, lumps in testicle or breasts MSK: Negative for muscle cramps or aches, joint pain or swelling Neuro: Negative for headaches, weakness, numbness, dizziness, passing out/fainting Psych: Negative for depression, anxiety, memory problems  Objective  Physical Exam Vitals:   02/18/21 1447 02/18/21 1501  BP: 140/70 130/70  Pulse: 68 65  Temp: 98 F (36.7 C)   SpO2: 99%     BP Readings from Last 3 Encounters:  02/18/21 130/70  01/12/21 (!) 145/80  03/30/19 128/74   Wt Readings from Last 3 Encounters:  02/18/21 190 lb 9.6 oz (86.5 kg)  01/12/21 190 lb 9.6 oz (86.5 kg)  01/05/19 184 lb 6.4 oz (83.6 kg)    Physical Exam Constitutional:      General: He is not in acute distress.    Appearance: He is not diaphoretic.  HENT:     Head: Normocephalic and atraumatic.  Cardiovascular:     Rate and Rhythm: Normal rate and regular rhythm.     Heart sounds: Normal heart sounds.  Pulmonary:     Effort: Pulmonary effort is normal.     Breath sounds: Normal breath sounds.  Abdominal:     General: Bowel sounds are normal. There is no distension.     Palpations: Abdomen is soft.     Tenderness: There is no abdominal tenderness. There is no guarding or rebound.  Musculoskeletal:     Right lower leg: No edema.     Left lower leg: No edema.  Lymphadenopathy:     Cervical: No cervical adenopathy.  Skin:    General: Skin is warm and dry.  Neurological:     Mental Status: He is alert.  Psychiatric:        Mood and Affect: Mood normal.     Assessment/Plan:   Problem List Items Addressed This Visit     Elbow clicking    He will call sports medicine to set up an appointment.      Encounter for general adult medical examination with abnormal findings - Primary    Physical exam completed.  I encouraged  healthy diet and exercise.  Encouraged continued reduction in caffeine intake.  I encouraged him to get the updated COVID booster.  I encouraged him to see an eye doctor periodically.  Lab work as outlined today.      Lightheadedness    Significant improvement with cutting out caffeine and increasing water intake prior to exercise.  He will continue with those interventions and continue to monitor.  If it worsens he will let us know.      Other Visit Diagnoses     Overweight       Relevant Orders   HgB A1c   Need for hepatitis C screening test  Relevant Orders   Hepatitis C Antibody   Lipid screening       Relevant Orders   Lipid panel       Return in about 1 year (around 02/18/2022) for CPE.  This visit occurred during the SARS-CoV-2 public health emergency.  Safety protocols were in place, including screening questions prior to the visit, additional usage of staff PPE, and extensive cleaning of exam room while observing appropriate contact time as indicated for disinfecting solutions.    Tommi Rumps, MD [place]

## 2021-02-18 NOTE — Patient Instructions (Signed)
Nice to see you. Please continue with healthy diet and exercise. Please make sure you hydrate adequately before working out. Please continue to monitor your blood pressure and if it trends up as we discussed please let us know. Please call sports medicine to set up an appointment for your right elbow. We will get lab work today and contact you with results.

## 2021-02-19 ENCOUNTER — Telehealth: Payer: Self-pay | Admitting: Family Medicine

## 2021-02-19 LAB — LIPID PANEL
Cholesterol: 195 mg/dL (ref 0–200)
HDL: 50.6 mg/dL (ref >39.00)
NonHDL: 144.01
Total CHOL/HDL Ratio: 4
Triglycerides: 228 mg/dL — ABNORMAL HIGH (ref 0.0–149.0)
VLDL: 45.6 mg/dL — ABNORMAL HIGH (ref 0.0–40.0)

## 2021-02-19 LAB — LDL CHOLESTEROL, DIRECT: Direct LDL: 131 mg/dL

## 2021-02-19 LAB — HEMOGLOBIN A1C: Hgb A1c MFr Bld: 5.2 % (ref 4.6–6.5)

## 2021-02-19 LAB — HEPATITIS C ANTIBODY
Hepatitis C Ab: NONREACTIVE
SIGNAL TO CUT-OFF: 0.05 (ref <1.00)

## 2021-02-19 NOTE — Telephone Encounter (Signed)
Pt called in stating that he have questions about his lab results that he received through mychart. Pt is request callback.

## 2021-02-20 NOTE — Telephone Encounter (Signed)
I called and spoke with the patient and asked if he had any questions about his labs from [facility] and he stated he was okay that he did a little research and he has no questions.  I informed him that for the cholesterol just watch his diet and exercise and he agreed.  Matthew Curry,cma

## 2021-05-20 ENCOUNTER — Ambulatory Visit: Payer: Managed Care, Other (non HMO) | Admitting: Family Medicine

## 2021-05-20 ENCOUNTER — Encounter: Payer: Self-pay | Admitting: Family Medicine

## 2021-05-20 ENCOUNTER — Other Ambulatory Visit: Payer: Self-pay

## 2021-05-20 VITALS — BP 140/80 | HR 85 | Temp 100.1°F | Ht 68.5 in | Wt 194.4 lb

## 2021-05-20 DIAGNOSIS — R509 Fever, unspecified: Secondary | ICD-10-CM | POA: Diagnosis not present

## 2021-05-20 LAB — POCT INFLUENZA A/B
Influenza A, POC: NEGATIVE
Influenza B, POC: NEGATIVE

## 2021-05-20 NOTE — Patient Instructions (Signed)
Nice to see you. ?Your symptoms are likely viral related.  We will contact you with your COVID-19 and flu test.  Please stay home until we get the results of your COVID-19 test. ?If you develop eye pain, vision changes, excessive light sensitivity, or excessive foreign body sensation in your eye please seek medical attention immediately.  If you develop fever of 103 ?F or higher that does not come down with Tylenol or ibuprofen you need to be reevaluated immediately as well.  If you have any worsening symptoms please let us know. ?

## 2021-05-20 NOTE — Progress Notes (Signed)
?Marikay Alar, MD ?Phone: [phone] ? ?Matthew Curry is a 31 y.o. male who presents today for same day visit.  ? ?Fever: Patient notes onset of symptoms on 05/17/2021.  Started with a red eye that has become crusty.  The next day he developed body aches and fever of 101 ?F.  He has had no cough or congestion.  He has mild sore throat.  No postnasal drip.  No taste or smell disturbances.  No known COVID or flu exposures.  He notes he was around his neighbors who advised him that they had adenovirus the prior week.  He also notes his daughter was sick last week though they were unable to get a COVID test for her.  He notes no eye pain.  No significant vision changes.  He notes his eyes been watering.  No photophobia.  He did a virtual visit and they gave him ofloxacin drops which have not been beneficial.  Patient has taken some Tylenol. ? ?Social History  ? ?Tobacco Use  ?Smoking Status Former  ?Smokeless Tobacco Never  ?Tobacco Comments  ? quit 1 year ago  ? ? ?Current Outpatient Medications on File Prior to Visit  ?Medication Sig Dispense Refill  ? ofloxacin (OCUFLOX) 0.3 % ophthalmic solution 1 drop 4 (four) times daily.    ? ?No current facility-administered medications on file prior to visit.  ? ? ? ?ROS see history of present illness ? ?Objective ? ?Physical Exam ?Vitals:  ? 05/20/21 1613  ?BP: 140/80  ?Pulse: 85  ?Temp: 100.1 ?F (37.8 ?C)  ?SpO2: 98%  ? ? ?BP Readings from Last 3 Encounters:  ?05/20/21 140/80  ?02/18/21 130/70  ?01/12/21 (!) 145/80  ? ?Wt Readings from Last 3 Encounters:  ?05/20/21 194 lb 6.4 oz (88.2 kg)  ?02/18/21 190 lb 9.6 oz (86.5 kg)  ?01/12/21 190 lb 9.6 oz (86.5 kg)  ? ? ?Physical Exam ?Constitutional:   ?   General: He is not in acute distress. ?   Appearance: He is not diaphoretic.  ?HENT:  ?   Right Ear: Tympanic membrane normal.  ?   Left Ear: Tympanic membrane normal.  ?Eyes:  ?   Comments: Right conjunctival erythema, watery discharge, no purulent discharge, pupils  are equal and reactive to light bilaterally, left conjunctive a with minimal erythema  ?Cardiovascular:  ?   Rate and Rhythm: Normal rate and regular rhythm.  ?   Heart sounds: Normal heart sounds.  ?Pulmonary:  ?   Effort: Pulmonary effort is normal.  ?   Breath sounds: Normal breath sounds.  ?Skin: ?   General: Skin is warm and dry.  ?Neurological:  ?   Mental Status: He is alert.  ? ? ? ?Assessment/Plan: Please see individual problem list. ? ?Problem List Items Addressed This Visit   ? ? Fever - Primary  ?  I suspect the patient has a viral illness.  He potentially could have adenovirus given his exposure previously.  We will test him for COVID and influenza.  He can continue Tylenol or ibuprofen for fevers or body aches.  I discussed that the eyedrops would likely not be beneficial and he can discontinue those.  Discussed red flag symptoms with his eyes that should result in emergency evaluation including eye pain, vision changes, photophobia, or significant foreign body sensation.  Discussed that he should seek medical attention for elevated fevers above 103 ?F if they do not come down with antipyretics.  Advised if he is not improving over 7  to 10 days he should let us know.  He will let us know if he has any worsening symptoms.  I advised him to stay away from his wife as he reports she is currently pregnant and due in the near future.  He will remain home until we have his COVID result. ?  ?  ? Relevant Orders  ? Novel Coronavirus, NAA ([facility])  ? POCT Influenza A/B (Completed)  ? ? ? ?Return if symptoms worsen or fail to improve. ? ?This visit occurred during the SARS-CoV-2 public health emergency.  Safety protocols were in place, including screening questions prior to the visit, additional usage of staff PPE, and extensive cleaning of exam room while observing appropriate contact time as indicated for disinfecting solutions.  ? ? ?Marikay Alar, MD ?[facility] [place] ? ?

## 2021-05-20 NOTE — Assessment & Plan Note (Addendum)
I suspect the patient has a viral illness.  He potentially could have adenovirus given his exposure previously.  We will test him for COVID and influenza.  He can continue Tylenol or ibuprofen for fevers or body aches.  I discussed that the eyedrops would likely not be beneficial and he can discontinue those.  Discussed red flag symptoms with his eyes that should result in emergency evaluation including eye pain, vision changes, photophobia, or significant foreign body sensation.  Discussed that he should seek medical attention for elevated fevers above 103 ?F if they do not come down with antipyretics.  Advised if he is not improving over 7 to 10 days he should let us know.  He will let us know if he has any worsening symptoms.  I advised him to stay away from his wife as he reports she is currently pregnant and due in the near future.  He will remain home until we have his COVID result. ?

## 2021-05-22 LAB — NOVEL CORONAVIRUS, NAA: SARS-CoV-2, NAA: NOT DETECTED

## 2021-07-27 ENCOUNTER — Encounter: Payer: Self-pay | Admitting: Family Medicine

## 2021-07-27 ENCOUNTER — Ambulatory Visit: Payer: Managed Care, Other (non HMO) | Admitting: Family Medicine

## 2021-07-27 VITALS — BP 140/78 | HR 81 | Ht 70.0 in | Wt 189.8 lb

## 2021-07-27 DIAGNOSIS — R229 Localized swelling, mass and lump, unspecified: Secondary | ICD-10-CM | POA: Diagnosis not present

## 2021-07-27 NOTE — Progress Notes (Signed)
  Marikay Alar, MD Phone: [phone]  Matthew Curry is a 31 y.o. male who presents today for same-day visit.  Skin nodule: Patient notes a nodule in his left anterior rib area as outlined in the physical exam section.  He notes there is no pain associated with this.  There is no itching.  He notes it has been present likely a year though he is not quite sure.  Social History   Tobacco Use  Smoking Status Former  Smokeless Tobacco Never  Tobacco Comments   quit 1 year ago    Current Outpatient Medications on File Prior to Visit  Medication Sig Dispense Refill   ofloxacin (OCUFLOX) 0.3 % ophthalmic solution 1 drop 4 (four) times daily. (Patient not taking: Reported on 07/27/2021)     No current facility-administered medications on file prior to visit.     ROS see history of present illness  Objective  Physical Exam Vitals:   07/27/21 1606  BP: 140/78  Pulse: 81  SpO2: 98%    BP Readings from Last 3 Encounters:  07/27/21 140/78  05/20/21 140/80  02/18/21 130/70   Wt Readings from Last 3 Encounters:  07/27/21 189 lb 12.8 oz (86.1 kg)  05/20/21 194 lb 6.4 oz (88.2 kg)  02/18/21 190 lb 9.6 oz (86.5 kg)    Physical Exam Skin:         Assessment/Plan: Please see individual problem list.  Problem List Items Addressed This Visit     Subcutaneous nodule - Primary    Likely a cyst though no overlying skin changes to indicate this.  Does not seem consistent with lipoma based on consistency of the lesion.  Discussed getting an ultrasound to evaluate this fully.  Order placed.       Relevant Orders   [place] CHEST SOFT TISSUE     Return if symptoms worsen or fail to improve.   Marikay Alar, MD [facility]

## 2021-07-27 NOTE — Assessment & Plan Note (Signed)
Likely a cyst though no overlying skin changes to indicate this.  Does not seem consistent with lipoma based on consistency of the lesion.  Discussed getting an ultrasound to evaluate this fully.  Order placed.

## 2021-07-27 NOTE — Progress Notes (Signed)
Acute 

## 2021-08-06 ENCOUNTER — Ambulatory Visit
Admission: RE | Admit: 2021-08-06 | Discharge: 2021-08-06 | Disposition: A | Discharge status: Home / Self Care | Payer: Managed Care, Other (non HMO) | Source: Ambulatory Visit | Attending: Family Medicine | Admitting: Family Medicine

## 2021-08-06 DIAGNOSIS — R229 Localized swelling, mass and lump, unspecified: Secondary | ICD-10-CM | POA: Insufficient documentation

## 2021-08-14 ENCOUNTER — Other Ambulatory Visit: Payer: Self-pay | Admitting: Family Medicine

## 2021-08-18 ENCOUNTER — Other Ambulatory Visit: Payer: Self-pay | Admitting: Family Medicine

## 2021-08-18 DIAGNOSIS — R229 Localized swelling, mass and lump, unspecified: Secondary | ICD-10-CM

## 2021-08-21 ENCOUNTER — Ambulatory Visit
Admission: RE | Admit: 2021-08-21 | Discharge: 2021-08-21 | Disposition: A | Discharge status: Home / Self Care | Payer: Managed Care, Other (non HMO) | Source: Ambulatory Visit | Attending: Family Medicine | Admitting: Family Medicine

## 2021-08-21 DIAGNOSIS — R229 Localized swelling, mass and lump, unspecified: Secondary | ICD-10-CM | POA: Diagnosis present

## 2021-08-21 MED ORDER — IOHEXOL 300 MG/ML  SOLN
75.0000 mL | Freq: Once | INTRAMUSCULAR | Status: AC | PRN
  Administered 2021-08-21: 75 mL via INTRAVENOUS

## 2021-08-27 ENCOUNTER — Other Ambulatory Visit: Payer: Self-pay

## 2021-08-27 ENCOUNTER — Telehealth: Payer: Self-pay

## 2021-08-27 NOTE — Telephone Encounter (Signed)
Patient returned Charlyne Mom, CMA's call regarding the results of his CT scan.

## 2021-08-27 NOTE — Telephone Encounter (Signed)
Spoke with patient, see result note.  Diyana Starrett,cma

## 2021-09-07 ENCOUNTER — Encounter: Payer: Self-pay | Admitting: *Deleted

## 2021-09-07 ENCOUNTER — Other Ambulatory Visit: Payer: Self-pay | Admitting: Family Medicine

## 2021-09-07 DIAGNOSIS — E320 Persistent hyperplasia of thymus: Secondary | ICD-10-CM

## 2021-09-11 ENCOUNTER — Telehealth: Payer: Self-pay

## 2021-09-11 NOTE — Telephone Encounter (Signed)
Patient is returning Charlyne Mom, CMA's call.

## 2021-09-11 NOTE — Telephone Encounter (Signed)
Called and spoke with patient see results note.  Anuhea Gassner,cma  

## 2021-09-11 NOTE — Telephone Encounter (Signed)
I did try to reach Charlyne Mom, CMA, but she was not available at the time patient called back.  Patient stated he was returning her call, but did not give further detail.

## 2021-10-12 ENCOUNTER — Telehealth: Payer: Self-pay

## 2021-10-12 NOTE — Telephone Encounter (Signed)
Patient states he would like to know when he last had a tetanus shot.

## 2021-10-15 NOTE — Telephone Encounter (Signed)
I called and informed the patient that his last tetanus was done in 2018 and he understood.  Staley Lunz,cma

## 2021-10-27 ENCOUNTER — Other Ambulatory Visit: Payer: Self-pay | Admitting: *Deleted

## 2021-10-27 ENCOUNTER — Institutional Professional Consult (permissible substitution): Payer: Managed Care, Other (non HMO) | Admitting: Thoracic Surgery (Cardiothoracic Vascular Surgery)

## 2021-10-27 VITALS — BP 146/84 | HR 67 | Resp 18 | Ht 70.0 in | Wt 189.0 lb

## 2021-10-27 DIAGNOSIS — E329 Disease of thymus, unspecified: Secondary | ICD-10-CM

## 2021-10-27 NOTE — Progress Notes (Signed)
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  Matthew Curry is a 31 year old male with a history of reflux, previous narcotic use, OCD, and anxiety.  Recently he noted a lump in the left anterior chest just below the pectoralis muscle.  He had a ultrasound to evaluate that which was not definitive.  He then had a CT of the chest to further evaluate.  No abnormality was seen in that area.  He was noted to have prominent soft tissue in the anterior mediastinum.  Also there were several small lung nodules noted.  He complains of reflux symptoms and occasionally he will have food sticking in his throat when he is trying to swallow.  This is usually with meat.  No trouble swallowing liquids.  Not having any chest pain, pressure, or tightness.  Denies palpitations and hair loss.  No double vision or weakness. Past Medical History:  Diagnosis Date   Anxiety    Chest tightness or pressure    GERD (gastroesophageal reflux disease)    History of narcotic addiction (HCC)    Previously on Suboxone, followed in [place]   Narcotic withdrawal (HCC) 12/20/2014   OCD (obsessive compulsive disorder)     Past Surgical History:  Procedure Laterality Date   ADENOIDECTOMY  1998    Family History  Problem Relation Age of Onset   Coronary artery disease Father        several of Pt family members(not mother or sibilings) have "leak valves"   Diabetes Father     Social History Social History   Tobacco Use   Smoking status: Former   Smokeless tobacco: Never   Tobacco comments:    quit 1 year ago  Substance Use Topics   Alcohol use: Yes    Comment: few beers/day   Drug use: Yes    Current Outpatient Medications  Medication Sig Dispense Refill   ofloxacin (OCUFLOX) 0.3 % ophthalmic solution 1 drop 4  (four) times daily.     No current facility-administered medications for this visit.    Allergies  Allergen Reactions   Sulfonamide Derivatives    Sulfadiazine Rash    Review of Systems  Constitutional:  Negative for activity change and unexpected weight change.  HENT:  Positive for trouble swallowing (Occasional, see HPI).   Eyes:  Negative for visual disturbance.  Respiratory:  Negative for chest tightness and shortness of breath.   Cardiovascular:  Negative for chest pain and leg swelling.  Gastrointestinal:  Positive for abdominal pain (Reflux).  Genitourinary:  Negative for difficulty urinating and dysuria.  Neurological:  Negative for speech difficulty and weakness.  Hematological:  Negative for adenopathy. Does not bruise/bleed easily.  All other systems reviewed and are negative.   BP (!) 146/84 (BP Location: Right Arm, Patient Position: Sitting)   Pulse 67   Resp 18   Ht 5\' 10"  (1.778 m)   Wt 189 lb (85.7 kg)   SpO2 97% Comment: RA  BMI 27.12 kg/m  Physical Exam Vitals reviewed.  Constitutional:      General: He is not in acute distress.    Appearance: Normal appearance.  HENT:     Head: Normocephalic and atraumatic.  Eyes:     General: No scleral icterus.    Extraocular Movements: Extraocular movements intact.  Cardiovascular:  Rate and Rhythm: Normal rate and regular rhythm.     Heart sounds: Normal heart sounds. No murmur heard. Pulmonary:     Effort: No respiratory distress.     Breath sounds: Normal breath sounds.  Abdominal:     General: There is no distension.     Palpations: Abdomen is soft.     Tenderness: There is no abdominal tenderness.  Musculoskeletal:     Cervical back: Neck supple.     Comments: No definite palpable abnormality left anterior chest wall  Lymphadenopathy:     Cervical: No cervical adenopathy.  Skin:    General: Skin is warm and dry.  Neurological:     General: No focal deficit present.     Mental Status: He is  alert and oriented to person, place, and time.     Cranial Nerves: No cranial nerve deficit.     Motor: No weakness.    Diagnostic Tests: CT CHEST WITH CONTRAST   TECHNIQUE: Multidetector CT imaging of the chest was performed during intravenous contrast administration.   RADIATION DOSE REDUCTION: This exam was performed according to the departmental dose-optimization program which includes automated exposure control, adjustment of the mA and/or kV according to patient size and/or use of iterative reconstruction technique.   CONTRAST:  59mL OMNIPAQUE IOHEXOL 300 MG/ML  SOLN   COMPARISON:  Soft tissue chest ultrasound 08/06/2021   FINDINGS: Cardiovascular: No significant vascular findings. Normal heart size. No pericardial effusion.   Mediastinum/Nodes: Anterior mediastinal soft tissue which likely represents thymus tissue. No bulky lymphadenopathy identified. Thyroid gland appears normal.   Lungs/Pleura: No focal consolidation identified. A few pulmonary nodules identified bilaterally measuring up to 5 mm perifissural on the right series 3, image 82, 3 mm pleural-based right upper lobe series 3, image 64, 3 mm in the left upper lobe series 3, image 70. No pleural effusion or pneumothorax. No endobronchial lesions.   Upper Abdomen: No acute process identified. Spleen is upper normal size.   Musculoskeletal: No chest wall abnormality. No acute or significant osseous findings.   IMPRESSION: 1. No chest wall mass identified. The subcentimeter subcutaneous likely lipoma seen on ultrasound is not definitely visualized. 2. Remnant thymus tissue noted which appears prominent for the patient's age, correlate clinically. 3. Multiple small pulmonary nodules as described, measuring up to 5 mm perifissural on the right. Most likely postinfectious given the patient's age. No routine follow-up required.     Electronically Signed   By: Jannifer Hick M.D.   On: 08/23/2021  20:13 I personally reviewed the CT images.  There are multiple tiny lung nodules.  Soft tissue in the anterior mediastinum in general shape of thymus gland with no definitive mass.  Impression: Matthew Curry is a 31 year old male with a history of reflux, previous narcotic use, OCD, and anxiety.  He recently was evaluated for a lump he had noted in the chest wall just below the pectoralis muscle.  He had a CT of the chest which showed no abnormality in that area but did show prominent anterior mediastinal soft tissue.  Reviewing the films, this appears most consistent with thymic hyperplasia.  There is no definitive mass lesion.  He does not have any symptoms of myasthenia.  No obvious signs or symptoms of hyperthyroidism but will check a TSH.  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  Cannot completely rule out the possibility of an underlying mass lesion, but no definite one seen on CT.  We will repeat a scan in about 3 months (5 months  from original) to see if there is regression or progression.  Plan: Check TSH to rule out hypothyroidism Return in 3 months with CT chest to follow-up thymic hyperplasia and assess any abnormality noted left anterior chest wall.  Loreli Slot, MD [facility] [phone]

## 2021-10-28 LAB — TSH: TSH: 1.38 mIU/L (ref 0.40–4.50)

## 2021-12-15 ENCOUNTER — Other Ambulatory Visit: Payer: Self-pay | Admitting: Thoracic Surgery (Cardiothoracic Vascular Surgery)

## 2021-12-15 DIAGNOSIS — R229 Localized swelling, mass and lump, unspecified: Secondary | ICD-10-CM

## 2022-01-18 ENCOUNTER — Ambulatory Visit (INDEPENDENT_AMBULATORY_CARE_PROVIDER_SITE_OTHER): Payer: Managed Care, Other (non HMO) | Admitting: Family

## 2022-01-18 ENCOUNTER — Encounter: Payer: Self-pay | Admitting: Family

## 2022-01-18 ENCOUNTER — Ambulatory Visit (INDEPENDENT_AMBULATORY_CARE_PROVIDER_SITE_OTHER): Payer: Managed Care, Other (non HMO)

## 2022-01-18 VITALS — BP 138/88 | HR 70 | Temp 98.7°F | Ht 69.0 in | Wt 192.8 lb

## 2022-01-18 DIAGNOSIS — J40 Bronchitis, not specified as acute or chronic: Secondary | ICD-10-CM

## 2022-01-18 MED ORDER — AZITHROMYCIN 250 MG PO TABS: ORAL_TABLET | ORAL | 0 refills | Status: AC

## 2022-01-18 MED ORDER — BENZONATATE 100 MG PO CAPS: 100.0000 mg | ORAL_CAPSULE | Freq: Three times a day (TID) | ORAL | 1 refills | Status: DC | PRN

## 2022-01-18 NOTE — Patient Instructions (Signed)
I have sent in azithromycin (antibiotic).  I have also sent in Tessalon Perles to use as needed for cough.  Please let me know how you are doing.  You may also use mucinex DM which is an expectorant and cough suppressant.   Ensure to take probiotics while on antibiotics and also for 2 weeks after completion. This can either be by eating yogurt daily or taking a probiotic supplement over the counter such as Culturelle.It is important to re-colonize the gut with good bacteria and also to prevent any diarrheal infections associated with antibiotic use.

## 2022-01-18 NOTE — Progress Notes (Signed)
Subjective:    Patient ID: Matthew Curry, male    DOB: April 12, 1990, 31 y.o.   MRN: [id number]  CC: Matthew Curry is a 31 y.o. male who presents today for an acute visit.    HPI: Complains of sinus congestion x 2 weeks, unchanged.   Wet cough throughout the day, cough better is a night,   No fever, chills. Diarrhea, sinus pain, sob.   Occasional wheezing.   Non smoker  No h/o asthma.   He tried mucinex which made him his congestion was thicker.    Went to [employer] this weekend    HISTORY:  Past Medical History:  Diagnosis Date   Anxiety    Chest tightness or pressure    GERD (gastroesophageal reflux disease)    History of narcotic addiction (HCC)    Previously on Suboxone, followed in [place]   Narcotic withdrawal (HCC) 12/20/2014   OCD (obsessive compulsive disorder)    Past Surgical History:  Procedure Laterality Date   ADENOIDECTOMY  1998   Family History  Problem Relation Age of Onset   Coronary artery disease Father        several of Pt family members(not mother or sibilings) have "leak valves"   Diabetes Father     Allergies: Sulfonamide derivatives and Sulfadiazine Current Outpatient Medications on File Prior to Visit  Medication Sig Dispense Refill   ofloxacin (OCUFLOX) 0.3 % ophthalmic solution 1 drop 4 (four) times daily.     No current facility-administered medications on file prior to visit.    Social History   Tobacco Use   Smoking status: Former   Smokeless tobacco: Never   Tobacco comments:    quit 1 year ago  Substance Use Topics   Alcohol use: Yes    Comment: few beers/day   Drug use: Yes    Review of Systems  Constitutional:  Negative for chills and fever.  HENT:  Positive for congestion. Negative for sinus pain and sore throat.   Respiratory:  Positive for cough and wheezing. Negative for shortness of breath.   Cardiovascular:  Negative for chest pain and palpitations.  Gastrointestinal:  Negative for nausea  and vomiting.      Objective:    BP 138/88 (BP Location: Left Arm, Patient Position: Sitting, Cuff Size: Normal)   Pulse 70   Temp 98.7 F (37.1 C) (Oral)   Ht 5\' 9"  (1.753 m)   Wt 192 lb 12.8 oz (87.5 kg)   SpO2 97%   BMI 28.47 kg/m    Physical Exam Vitals reviewed.  Constitutional:      Appearance: He is well-developed.  HENT:     Head: Normocephalic and atraumatic.     Right Ear: Hearing, tympanic membrane, ear canal and external ear normal. No decreased hearing noted. No drainage, swelling or tenderness. No middle ear effusion. Tympanic membrane is not injected, erythematous or bulging.     Left Ear: Hearing, tympanic membrane, ear canal and external ear normal. No decreased hearing noted. No drainage, swelling or tenderness.  No middle ear effusion. Tympanic membrane is not injected, erythematous or bulging.     Nose: Nose normal.     Right Sinus: No maxillary sinus tenderness or frontal sinus tenderness.     Left Sinus: No maxillary sinus tenderness or frontal sinus tenderness.     Mouth/Throat:     Pharynx: Uvula midline. No oropharyngeal exudate or posterior oropharyngeal erythema.     Tonsils: No tonsillar abscesses.  Eyes:  Conjunctiva/sclera: Conjunctivae normal.  Cardiovascular:     Rate and Rhythm: Regular rhythm.     Heart sounds: Normal heart sounds.  Pulmonary:     Effort: Pulmonary effort is normal. No respiratory distress.     Breath sounds: Normal breath sounds. No wheezing, rhonchi or rales.  Lymphadenopathy:     Head:     Right side of head: No submental, submandibular, tonsillar, preauricular, posterior auricular or occipital adenopathy.     Left side of head: No submental, submandibular, tonsillar, preauricular, posterior auricular or occipital adenopathy.     Cervical: No cervical adenopathy.  Skin:    General: Skin is warm and dry.  Neurological:     Mental Status: He is alert.  Psychiatric:        Speech: Speech normal.        Behavior:  Behavior normal.        Assessment & Plan:   Problem List Items Addressed This Visit       Respiratory   Bronchitis - Primary    Patient nontoxic in appearance.  No acute respiratory distress.  Rhonchi heard on exam.  Pending chest x-ray to evaluate for pneumonia.  Start azithromycin, Tessalon.  He politely declines albuterol inhaler to be used as needed.  He will let me know how he is doing      Relevant Medications   azithromycin (ZITHROMAX) 250 MG tablet   benzonatate (TESSALON) 100 MG capsule   Other Relevant Orders   DG Chest 2 View       I am having Matthew Curry start on azithromycin and benzonatate. I am also having him maintain his ofloxacin.   Meds ordered this encounter  Medications   azithromycin (ZITHROMAX) 250 MG tablet    Sig: Take 2 tablets on day 1, then 1 tablet daily on days 2 through 5    Dispense:  6 tablet    Refill:  0    Order Specific Question:   Supervising Provider    Answer:   Sherlene Shams [[id number]]   benzonatate (TESSALON) 100 MG capsule    Sig: Take 1 capsule (100 mg total) by mouth 3 (three) times daily as needed for cough.    Dispense:  20 capsule    Refill:  1    Order Specific Question:   Supervising Provider    Answer:   Sherlene Shams [[id number]]    Return precautions given.   Risks, benefits, and alternatives of the medications and treatment plan prescribed today were discussed, and patient expressed understanding.   Education regarding symptom management and diagnosis given to patient on AVS.  Continue to follow with Glori Luis, MD for routine health maintenance.   Matthew Curry and I agreed with plan.   Matthew Plowman, FNP

## 2022-01-18 NOTE — Assessment & Plan Note (Signed)
Patient nontoxic in appearance.  No acute respiratory distress.  Rhonchi heard on exam.  Pending chest x-ray to evaluate for pneumonia.  Start azithromycin, Tessalon.  He politely declines albuterol inhaler to be used as needed.  He will let me know how he is doing

## 2022-02-08 ENCOUNTER — Encounter: Payer: Self-pay | Admitting: Thoracic Surgery (Cardiothoracic Vascular Surgery)

## 2022-02-09 ENCOUNTER — Encounter: Payer: Self-pay | Admitting: Thoracic Surgery (Cardiothoracic Vascular Surgery)

## 2022-02-09 ENCOUNTER — Ambulatory Visit: Payer: Managed Care, Other (non HMO) | Admitting: Thoracic Surgery (Cardiothoracic Vascular Surgery)

## 2022-02-09 ENCOUNTER — Ambulatory Visit
Admission: RE | Admit: 2022-02-09 | Discharge: 2022-02-09 | Disposition: A | Discharge status: Home / Self Care | Payer: Managed Care, Other (non HMO) | Source: Ambulatory Visit | Attending: Thoracic Surgery (Cardiothoracic Vascular Surgery) | Admitting: Thoracic Surgery (Cardiothoracic Vascular Surgery)

## 2022-02-09 ENCOUNTER — Other Ambulatory Visit: Payer: Self-pay | Admitting: Thoracic Surgery (Cardiothoracic Vascular Surgery)

## 2022-02-09 VITALS — BP 140/75 | HR 64 | Resp 20 | Ht 69.0 in | Wt 192.0 lb

## 2022-02-09 DIAGNOSIS — J9859 Other diseases of mediastinum, not elsewhere classified: Secondary | ICD-10-CM

## 2022-02-09 DIAGNOSIS — E329 Disease of thymus, unspecified: Secondary | ICD-10-CM

## 2022-02-09 DIAGNOSIS — R229 Localized swelling, mass and lump, unspecified: Secondary | ICD-10-CM

## 2022-02-09 MED ORDER — IOPAMIDOL (ISOVUE-300) INJECTION 61%
75.0000 mL | Freq: Once | INTRAVENOUS | Status: AC | PRN
  Administered 2022-02-09: 75 mL via INTRAVENOUS

## 2022-02-09 NOTE — Progress Notes (Signed)
[address]       [place] [address]             [phone]     HPI: Matthew Curry returns for follow-up of anterior mediastinal soft tissue mass  Matthew Curry is a 31 year old male with a history of OCD, anxiety, narcotic use and reflux.  He noted a lump on the left anterior chest below the pectoralis muscle.  An ultrasound to evaluate that was not definitive.  A CT of the chest was done.  It showed no abnormality in that area but did show prominent soft tissue in the anterior mediastinum.  There also were several small lung nodules bilaterally.  I saw him in August.  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  Recommended a follow-up CT.  He now returns to discuss the results.  In the interim since his last visit he has been feeling well.  He still has the lump inferior to the pectoralis muscle.  Continues to have occasional issues with reflux and some swallowing issues.  Past Medical History:  Diagnosis Date   Anxiety    Chest tightness or pressure    GERD (gastroesophageal reflux disease)    History of narcotic addiction ([place])    Previously on Suboxone, followed in [place]   Narcotic withdrawal ([place]) 12/20/2014   OCD (obsessive compulsive disorder)     Current Outpatient Medications  Medication Sig Dispense Refill   benzonatate (TESSALON) 100 MG capsule Take 1 capsule (100 mg total) by mouth 3 (three) times daily as needed for cough. (Patient not taking: Reported on 02/09/2022) 20 capsule 1   ofloxacin (OCUFLOX) 0.3 % ophthalmic solution 1 drop 4 (four) times daily. (Patient not taking: Reported on 02/09/2022)     No current facility-administered medications for this visit.    Physical Exam BP (!) 140/75   Pulse 64   Resp 20   Ht 5\' 9"  (1.753 m)   Wt 192 lb (87.1 kg)   SpO2 98% Comment: RA  BMI 28.48 kg/m  31 year old man in no acute distress Alert and oriented x3 no focal deficits Vague nodularity left subpectoral chest wall anteriorly,  but no discrete mass.  Diagnostic Tests: CT CHEST WITH CONTRAST   TECHNIQUE: Multidetector CT imaging of the chest was performed during intravenous contrast administration.   RADIATION DOSE REDUCTION: This exam was performed according to the departmental dose-optimization program which includes automated exposure control, adjustment of the mA and/or kV according to patient size and/or use of iterative reconstruction technique.   CONTRAST:  15mL ISOVUE-300 IOPAMIDOL (ISOVUE-300) INJECTION 61%   COMPARISON:  Chest CT dated September 20, 2021   FINDINGS: Cardiovascular: Normal heart size. No pericardial effusion. Normal caliber thoracic aorta with no atherosclerotic disease suspicious filling defects of the central pulmonary arteries. No coronary artery calcifications.   Mediastinum/Nodes: Esophagus and thyroid are unremarkable. No pathologically enlarged lymph nodes seen in the chest. Triangular soft tissue of the anterior mediastinum with areas of interspersed macroscopic fat seen inferiorly. A more convex margin is noted superiorly on series 2, image 29.   Lungs/Pleura: Central airways are patent. No consolidation, pleural effusion or pneumothorax. Stable small solid pulmonary nodules, likely sequela of prior infection. Reference solid nodule measuring 3 mm on series 6, image 68.   Upper Abdomen: No acute abnormality.   Musculoskeletal: No chest wall abnormality. No acute or significant osseous findings.   IMPRESSION: 1. Unchanged appearance of residual thymic tissue with a more convex margin superiorly which  is suggestive of thymic hyperplasia. Could be definitively characterized with contrast-enhanced chest MRI. 2. Stable small solid pulmonary nodules, likely sequela of prior infection.     Electronically Signed   By: Matthew Curry M.D.   On: 02/09/2022 09:17    Impression: Matthew Curry  is a 31 year old male with a history of OCD, anxiety, narcotic use and  reflux.  Found to have anterior mediastinal soft tissue mass on CT to evaluate for chest wall abnormality.  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  Will include with IV contrast per radiologist recommendations to further characterize.  Chest wall nodule-still more of a prominence rather than a distinct nodule.  He has not noticed any change over the past 3 months.  Continue to follow.  Lung nodules-stable.  Small nodules likely secondary to previous granulomatous disease.  Plan: MR chest with IV contrast Return in 1 to 2 weeks to discuss results  Spent over 20 minutes in review of records, images, and consultation with Matthew Curry today Loreli Slot, MD [facility] [phone]

## 2022-02-16 ENCOUNTER — Telehealth: Payer: Self-pay

## 2022-02-16 ENCOUNTER — Ambulatory Visit: Payer: Managed Care, Other (non HMO) | Admitting: Internal Medicine

## 2022-02-16 ENCOUNTER — Encounter: Payer: Self-pay | Admitting: Internal Medicine

## 2022-02-16 VITALS — BP 120/86 | HR 76 | Temp 97.8°F | Wt 193.0 lb

## 2022-02-16 DIAGNOSIS — H6993 Unspecified Eustachian tube disorder, bilateral: Secondary | ICD-10-CM | POA: Diagnosis not present

## 2022-02-16 NOTE — Progress Notes (Signed)
     Established Patient Office Visit     CC/Reason for Visit: Left ear pressure  HPI: Matthew Curry is a 31 y.o. male who is coming in today for the above mentioned reasons.  3 weeks ago he was diagnosed with acute bronchitis and treated with a Z-Pak.  He slowly improved however he has remaining left ear pressure.  He hears that it pops at times.  No fever, no discharge.   Past Medical/Surgical History: Past Medical History:  Diagnosis Date   Anxiety    Chest tightness or pressure    GERD (gastroesophageal reflux disease)    History of narcotic addiction (HCC)    Previously on Suboxone, followed in [place]   Narcotic withdrawal (HCC) 12/20/2014   OCD (obsessive compulsive disorder)     Past Surgical History:  Procedure Laterality Date   ADENOIDECTOMY  1998    Social History:  reports that he has quit smoking. He has never used smokeless tobacco. He reports current alcohol use. He reports current drug use.  Allergies: Allergies  Allergen Reactions   Sulfonamide Derivatives    Sulfadiazine Rash    Family History:  Family History  Problem Relation Age of Onset   Coronary artery disease Father        several of Pt family members(not mother or sibilings) have "leak valves"   Diabetes Father     No current outpatient medications on file.  Review of Systems:  Constitutional: Denies fever, chills, diaphoresis, appetite change and fatigue.  HEENT: Denies photophobia, eye pain, redness, mouth sores, trouble swallowing, neck pain, neck stiffness and tinnitus.   Respiratory: Denies SOB, DOE, chest tightness,  and wheezing.   Cardiovascular: Denies chest pain, palpitations and leg swelling.  Gastrointestinal: Denies nausea, vomiting, abdominal pain, diarrhea, constipation, blood in stool and abdominal distention.  Genitourinary: Denies dysuria, urgency, frequency, hematuria, flank pain and difficulty urinating.  Endocrine: Denies: hot or cold intolerance, sweats,  changes in hair or nails, polyuria, polydipsia. Musculoskeletal: Denies myalgias, back pain, joint swelling, arthralgias and gait problem.  Skin: Denies pallor, rash and wound.  Neurological: Denies dizziness, seizures, syncope, weakness, light-headedness, numbness and headaches.  Hematological: Denies adenopathy. Easy bruising, personal or family bleeding history  Psychiatric/Behavioral: Denies suicidal ideation, mood changes, confusion, nervousness, sleep disturbance and agitation    Physical Exam: Vitals:   02/16/22 1519  BP: 120/86  Pulse: 76  Temp: 97.8 F (36.6 C)  TempSrc: Oral  SpO2: 98%  Weight: 193 lb (87.5 kg)    Body mass index is 28.5 kg/m.   Constitutional: NAD, calm, comfortable Eyes: PERRL, lids and conjunctivae normal ENMT: Mucous membranes are moist.  Tympanic membrane is pearly white, no erythema or bulging. Psychiatric: Normal judgment and insight. Alert and oriented x 3. Normal mood.    Impression and Plan:  Eustachian tube disorder, bilateral -No sign of ear infection. -Likely eustachian tube dysfunction, advised a combination of antihistamines and guaifenesin for 2 weeks, he will follow-up if no improvement.   Time spent:20 minutes reviewing chart, interviewing and examining patient and formulating plan of care.      Chaya Jan, MD  Primary Care at [facility]

## 2022-02-19 ENCOUNTER — Encounter: Payer: Managed Care, Other (non HMO) | Admitting: Family Medicine

## 2022-03-02 ENCOUNTER — Encounter: Payer: Managed Care, Other (non HMO) | Admitting: Family Medicine

## 2022-03-11 ENCOUNTER — Encounter: Payer: Self-pay | Admitting: Family Medicine

## 2022-03-11 ENCOUNTER — Ambulatory Visit (INDEPENDENT_AMBULATORY_CARE_PROVIDER_SITE_OTHER): Payer: Managed Care, Other (non HMO) | Admitting: Family Medicine

## 2022-03-11 VITALS — BP 110/70 | HR 63 | Temp 98.5°F | Ht 69.0 in | Wt 190.6 lb

## 2022-03-11 DIAGNOSIS — Z23 Encounter for immunization: Secondary | ICD-10-CM | POA: Diagnosis not present

## 2022-03-11 DIAGNOSIS — Z0000 Encounter for general adult medical examination without abnormal findings: Secondary | ICD-10-CM

## 2022-03-11 DIAGNOSIS — E663 Overweight: Secondary | ICD-10-CM | POA: Diagnosis not present

## 2022-03-11 LAB — POCT GLYCOSYLATED HEMOGLOBIN (HGB A1C): Hemoglobin A1C: 4.8 % (ref 4.0–5.6)

## 2022-03-11 NOTE — Assessment & Plan Note (Signed)
Physical exam completed.  Encouraged healthy diet and exercise.  Discussed that all meat diet is not a good thing to do for your cholesterol.  He declines further COVID vaccinations.  Hepatitis C and HIV screening are up-to-date.  Discussed checking an A1c today for diabetes screening given that he is overweight.  Lipid panel is not indicated this year.

## 2022-03-11 NOTE — Progress Notes (Signed)
Tommi Rumps, MD Phone: [phone]  Sirron Ryne Sandlin is a 32 y.o. male who presents today for CPE.  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  They plan to follow-up on this with an MRI this year.   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   RUQ pain 05/13/2017   Tarsal tunnel syndrome 08/16/2017   Lightheadedness 01/12/2021   Encounter to establish care with new doctor 09/81/1914   Elbow clicking 78/29/5621   Fever 05/20/2021   Subcutaneous nodule 07/27/2021   Bronchitis 01/18/2022   Resolved Ambulatory Problems    Diagnosis Date Noted   ANXIETY 07/15/2008   Obsessive-compulsive disorders 07/15/2008   GERD 07/15/2008   CHEST PAIN-UNSPECIFIED 07/15/2008   Shortness of breath 03/11/2014   Skin nodule 03/11/2014   Mouth ulcer 08/12/2014   Narcotic withdrawal ([place]) 12/20/2014   Enlarged lymph node 04/01/2015   Skin lesion 05/27/2016   Sore throat 05/27/2016   Past Medical History:  Diagnosis Date   Anxiety    Chest tightness or pressure    GERD (gastroesophageal reflux disease)    History of narcotic addiction (HCC)    OCD (obsessive compulsive disorder)     Family History  Problem Relation Age of Onset    Coronary artery disease Father        several of Pt family members(not mother or sibilings) have "leak valves"   Diabetes Father     Social History   Socioeconomic History   Marital status: Single    Spouse name: Not on file   Number of children: Not on file   Years of education: Not on file   Highest education level: Not on file  Occupational History   Not on file  Tobacco Use   Smoking status: Former   Smokeless tobacco: Never   Tobacco comments:    quit 1 year ago  Substance and Sexual Activity   Alcohol use: Yes    Comment: few beers/day   Drug use: Yes   Sexual activity: Not on file  Other Topics Concern   Not on file  Social History Narrative   Lives in [place] with girlfriend. No children. Has 2 dogs in home.      Work - Associate Professor      Diet - regular      [place] about 1x per week   Social Determinants of Radio broadcast assistant Strain: Not on file  Food Insecurity: Not on file  Transportation Needs: Not on file  Physical Activity: Not on file  Stress: Not on file  Social Connections: Not on file  Intimate Partner Violence: Not on file    ROS  General:  Negative for nexplained weight loss, fever Skin: Negative for new or changing mole, sore  that won't heal HEENT: Negative for trouble hearing, trouble seeing, ringing in ears, mouth sores, hoarseness, change in voice, dysphagia. CV:  Negative for chest pain, dyspnea, edema, palpitations Resp: Negative for cough, dyspnea, hemoptysis GI: Negative for nausea, vomiting, diarrhea, constipation, abdominal pain, melena, hematochezia. GU: Negative for dysuria, incontinence, urinary hesitance, hematuria, vaginal or penile discharge, polyuria, sexual difficulty, lumps in testicle or breasts MSK: Negative for muscle cramps or aches, joint pain or swelling Neuro: Negative for headaches, weakness, numbness, dizziness, passing out/fainting Psych: Negative for depression, anxiety,  memory problems  Objective  Physical Exam Vitals:   03/11/22 1105  BP: 110/70  Pulse: 63  Temp: 98.5 F (36.9 C)  SpO2: 98%    BP Readings from Last 3 Encounters:  03/11/22 110/70  02/16/22 120/86  02/09/22 (!) 140/75   Wt Readings from Last 3 Encounters:  03/11/22 190 lb 9.6 oz (86.5 kg)  02/16/22 193 lb (87.5 kg)  02/09/22 192 lb (87.1 kg)    Physical Exam Constitutional:      General: He is not in acute distress.    Appearance: He is not diaphoretic.  HENT:     Head: Normocephalic and atraumatic.  Cardiovascular:     Rate and Rhythm: Normal rate and regular rhythm.     Heart sounds: Normal heart sounds.  Pulmonary:     Effort: Pulmonary effort is normal.     Breath sounds: Normal breath sounds.  Abdominal:     General: Bowel sounds are normal. There is no distension.     Palpations: Abdomen is soft.     Tenderness: There is no abdominal tenderness.  Musculoskeletal:     Right lower leg: No edema.     Left lower leg: No edema.  Lymphadenopathy:     Cervical: No cervical adenopathy.  Skin:    General: Skin is warm and dry.  Neurological:     Mental Status: He is alert.  Psychiatric:        Mood and Affect: Mood normal.      Assessment/Plan:   Routine general medical examination at a health care facility Assessment & Plan: Physical exam completed.  Encouraged healthy diet and exercise.  Discussed that all meat diet is not a good thing to do for your cholesterol.  He declines further COVID vaccinations.  Hepatitis C and HIV screening are up-to-date.  Discussed checking an A1c today for diabetes screening given that he is overweight.  Lipid panel is not indicated this year.   Need for immunization against influenza -     Flu Vaccine QUAD 58mo+IM (Fluarix, Fluzone & Alfiuria Quad PF)  Overweight -     POCT glycosylated hemoglobin (Hb A1C)    Return in about 1 year (around 03/12/2023) for CPE.   Tommi Rumps, MD [place]

## 2022-04-13 ENCOUNTER — Ambulatory Visit
Admission: RE | Admit: 2022-04-13 | Discharge: 2022-04-13 | Disposition: A | Discharge status: Home / Self Care | Payer: Managed Care, Other (non HMO) | Attending: Nurse Practitioner | Admitting: Nurse Practitioner

## 2022-04-13 ENCOUNTER — Ambulatory Visit
Admission: RE | Admit: 2022-04-13 | Discharge: 2022-04-13 | Disposition: A | Discharge status: Home / Self Care | Payer: Managed Care, Other (non HMO) | Source: Ambulatory Visit | Attending: Nurse Practitioner

## 2022-04-13 ENCOUNTER — Encounter: Payer: Self-pay | Admitting: Nurse Practitioner

## 2022-04-13 ENCOUNTER — Ambulatory Visit: Payer: Managed Care, Other (non HMO) | Admitting: Nurse Practitioner

## 2022-04-13 VITALS — BP 128/76 | HR 73 | Temp 98.3°F | Ht 69.0 in | Wt 187.8 lb

## 2022-04-13 DIAGNOSIS — M545 Low back pain, unspecified: Secondary | ICD-10-CM | POA: Diagnosis present

## 2022-04-13 NOTE — Progress Notes (Signed)
  Tomasita Morrow, NP-C Phone: [phone]  Matthew Curry is a 32 y.o. male who presents today for back pain. Patient reports getting hit by a roof truss that fell at work on Friday. Reports he was hit on the left back side of his head, right shoulder and across his back. Patient denies any headaches and shoulder pain.  Back Pain: Patient presents for presents evaluation of low back problems.  Symptoms have been present for 3 days and include pain in lower back (sharp and pinching in character; 5/10 in severity). Initial inciting event:  injury as described above . Symptoms are intermittent. Alleviating factors identifiable by patient are medication Ibuprofen and walking. Exacerbating factors identifiable by patient are recumbency and sitting. Treatments so far initiated by patient:  ice and Ibuprofen, with some relief in pain.  Previous lower back problems: none. Previous workup: none. Previous treatments: none.   Social History   Tobacco Use  Smoking Status Former  Smokeless Tobacco Never  Tobacco Comments   quit 1 year ago    No current outpatient medications on file prior to visit.   No current facility-administered medications on file prior to visit.     ROS see history of present illness  Objective  Physical Exam Vitals:   04/13/22 1456  BP: 128/76  Pulse: 73  Temp: 98.3 F (36.8 C)  SpO2: 98%    BP Readings from Last 3 Encounters:  04/13/22 128/76  03/11/22 110/70  02/16/22 120/86   Wt Readings from Last 3 Encounters:  04/13/22 187 lb 12.8 oz (85.2 kg)  03/11/22 190 lb 9.6 oz (86.5 kg)  02/16/22 193 lb (87.5 kg)    Physical Exam Constitutional:      General: He is not in acute distress.    Appearance: Normal appearance.  HENT:     Head: Normocephalic.  Cardiovascular:     Rate and Rhythm: Normal rate and regular rhythm.     Heart sounds: Normal heart sounds.  Pulmonary:     Effort: Pulmonary effort is normal.     Breath sounds: Normal breath  sounds.  Musculoskeletal:        General: Normal range of motion.     Lumbar back: Normal. No swelling, edema, deformity, tenderness or bony tenderness. Normal range of motion.  Skin:    General: Skin is warm and dry.  Neurological:     General: No focal deficit present.     Mental Status: He is alert.     Sensory: No sensory deficit.     Motor: No weakness.     Coordination: Coordination normal.     Gait: Gait normal.  Psychiatric:        Mood and Affect: Mood normal.        Behavior: Behavior normal.    Assessment/Plan: Please see individual problem list.  Acute midline low back pain without sciatica Assessment & Plan: Likely musculoskeletal due to injury. Will get xray of lumbar spine. Exam WNL. Encouraged alternating ice and heat on painful area and staying active. Continue Ibuprofen 800 mg every 8 hours as needed. Will contact patient with results of xray.   Orders: -     DG Lumbar Spine 2-3 Views; Future   Return if symptoms worsen or fail to improve.   Tomasita Morrow, NP-C [place]

## 2022-04-13 NOTE — Assessment & Plan Note (Signed)
Likely musculoskeletal due to injury. Will get xray of lumbar spine. Exam WNL. Encouraged alternating ice and heat on painful area and staying active. Continue Ibuprofen 800 mg every 8 hours as needed. Will contact patient with results of xray.

## 2022-05-07 ENCOUNTER — Encounter: Payer: Self-pay | Admitting: Nurse Practitioner

## 2022-05-07 ENCOUNTER — Ambulatory Visit: Payer: Managed Care, Other (non HMO) | Admitting: Nurse Practitioner

## 2022-05-07 VITALS — BP 134/76 | HR 74 | Temp 97.9°F | Ht 69.0 in | Wt 189.6 lb

## 2022-05-07 DIAGNOSIS — R519 Headache, unspecified: Secondary | ICD-10-CM | POA: Diagnosis not present

## 2022-05-07 DIAGNOSIS — M25511 Pain in right shoulder: Secondary | ICD-10-CM

## 2022-05-07 DIAGNOSIS — R030 Elevated blood-pressure reading, without diagnosis of hypertension: Secondary | ICD-10-CM

## 2022-05-07 NOTE — Patient Instructions (Addendum)
Referral sent to PT for shoulder pain Use OTC NSAIDs for headache and use cold pack. Check blood pressure at home and bring readings during the next visit. Follow-up in 4 weeks

## 2022-05-07 NOTE — Progress Notes (Unsigned)
Established Patient Office Visit  Subjective:  Patient ID: Matthew Curry, male    DOB: 1990/08/30  Age: 32 y.o. MRN: [id number]  CC:  Chief Complaint  Patient presents with  . Headache    Headache x 1 week Stress makes the headaches worse    HPI  Matthew Curry presents for right shoulder pain, headache and elevated BP. He has h/o of migraine.       Referral sent to PT for shoulder pain. Continue OTC NSAIDs for headache , use cold pack. Check BP for 2 weeks and bring the reading in  2 weeks.   Headache  This is a new problem. The current episode started 1 to 4 weeks ago. The problem occurs constantly. The problem has been gradually improving. The pain is located in the Bilateral region. The quality of the pain is described as aching and shooting. He has tried Excedrin and NSAIDs for the symptoms. His past medical history is significant for migraine headaches.     Past Medical History:  Diagnosis Date  . Anxiety   . Chest tightness or pressure   . GERD (gastroesophageal reflux disease)   . History of narcotic addiction ([place])    Previously on Suboxone, followed in [place]  . Narcotic withdrawal ([place]) 12/20/2014  . OCD (obsessive compulsive disorder)     Past Surgical History:  Procedure Laterality Date  . ADENOIDECTOMY  1998    Family History  Problem Relation Age of Onset  . Coronary artery disease Father        several of Pt family members(not mother or sibilings) have "leak valves"  . Diabetes Father     Social History   Socioeconomic History  . Marital status: Single    Spouse name: Not on file  . Number of children: Not on file  . Years of education: Not on file  . Highest education level: Not on file  Occupational History  . Not on file  Tobacco Use  . Smoking status: Former  . Smokeless tobacco: Never  . Tobacco comments:    quit 1 year ago  Substance and Sexual Activity  . Alcohol use: Yes    Comment: few beers/day  . Drug  use: Yes  . Sexual activity: Not on file  Other Topics Concern  . Not on file  Social History Narrative   Lives in [place] with girlfriend. No children. Has 2 dogs in home.      Work - Associate Professor      Diet - regular      [place] about 1x per week   Social Determinants of Radio broadcast assistant Strain: Not on file  Food Insecurity: Not on file  Transportation Needs: Not on file  Physical Activity: Not on file  Stress: Not on file  Social Connections: Not on file  Intimate Partner Violence: Not on file     No outpatient medications prior to visit.   No facility-administered medications prior to visit.    Allergies  Allergen Reactions  . Sulfonamide Derivatives   . Sulfadiazine Rash    ROS Review of Systems  Neurological:  Positive for headaches.      Objective:    Physical Exam  BP 134/76   Pulse 74   Temp 97.9 F (36.6 C)   Ht '5\' 9"'$  (1.753 m)   Wt 189 lb 9.6 oz (86 kg)   SpO2 99%   BMI 28.00 kg/m  Wt Readings from  Last 3 Encounters:  05/07/22 189 lb 9.6 oz (86 kg)  04/13/22 187 lb 12.8 oz (85.2 kg)  03/11/22 190 lb 9.6 oz (86.5 kg)     Health Maintenance  Topic Date Due  . COVID-19 Vaccine (3 - 2023-24 season) 05/22/2022 (Originally 11/06/2021)  . DTaP/Tdap/Td (3 - Td or Tdap) 06/26/2026  . INFLUENZA VACCINE  Completed  . Hepatitis C Screening  Completed  . HIV Screening  Completed  . HPV VACCINES  Aged Out    There are no preventive care reminders to display for this patient.  Lab Results  Component Value Date   TSH 1.38 10/27/2021   Lab Results  Component Value Date   WBC 4.7 01/05/2019   HGB 15.6 01/05/2019   HCT 43.9 01/05/2019   MCV 86.0 01/05/2019   PLT 186.0 01/05/2019   Lab Results  Component Value Date   NA 139 01/12/2021   K 4.4 01/12/2021   CO2 28 01/12/2021   GLUCOSE 82 01/12/2021   BUN 18 01/12/2021   CREATININE 0.94 01/12/2021   BILITOT 0.5 01/12/2021   ALKPHOS 43 01/12/2021    AST 18 01/12/2021   ALT 22 01/12/2021   PROT 6.6 01/12/2021   ALBUMIN 4.4 01/12/2021   CALCIUM 9.3 01/12/2021   GFR 108.63 01/12/2021   Lab Results  Component Value Date   CHOL 195 02/18/2021   Lab Results  Component Value Date   HDL 50.60 02/18/2021   No results found for: "LDLCALC" Lab Results  Component Value Date   TRIG 228.0 (H) 02/18/2021   Lab Results  Component Value Date   CHOLHDL 4 02/18/2021   Lab Results  Component Value Date   HGBA1C 4.8 03/11/2022      Assessment & Plan:  Right shoulder pain, unspecified chronicity -     Ambulatory referral to Physical Therapy    Follow-up: No follow-ups on file.   Theresia Lo, NP

## 2022-05-12 ENCOUNTER — Encounter: Payer: Self-pay | Admitting: Nurse Practitioner

## 2022-05-12 DIAGNOSIS — R519 Headache, unspecified: Secondary | ICD-10-CM | POA: Insufficient documentation

## 2022-05-12 DIAGNOSIS — M25511 Pain in right shoulder: Secondary | ICD-10-CM | POA: Insufficient documentation

## 2022-05-12 NOTE — Assessment & Plan Note (Signed)
Patient BP  Vitals:   05/07/22 1612  BP: 134/76    in the office. Advised pt to follow a low sodium and heart healthy diet. Advised him to check blood pressure at home and bring readings to the next appointment in 2 weeks. Will continue to monitor.

## 2022-05-12 NOTE — Assessment & Plan Note (Signed)
Advised patient to do stretching exercises and massage. Use cold pack and OTC NSAIDs for headache. If no improvement in headaches we will do imaging for further evaluation.

## 2022-05-12 NOTE — Assessment & Plan Note (Signed)
Advised alternate hot and cold pack. Would refer for physical therapy.

## 2022-10-25 ENCOUNTER — Other Ambulatory Visit: Payer: Self-pay | Admitting: Thoracic Surgery (Cardiothoracic Vascular Surgery)

## 2022-10-25 DIAGNOSIS — J9859 Other diseases of mediastinum, not elsewhere classified: Secondary | ICD-10-CM

## 2022-11-16 ENCOUNTER — Ambulatory Visit
Admission: RE | Admit: 2022-11-16 | Discharge: 2022-11-16 | Disposition: A | Discharge status: Home / Self Care | Payer: Managed Care, Other (non HMO) | Source: Ambulatory Visit | Attending: Thoracic Surgery (Cardiothoracic Vascular Surgery) | Admitting: Thoracic Surgery (Cardiothoracic Vascular Surgery)

## 2022-11-16 DIAGNOSIS — J9859 Other diseases of mediastinum, not elsewhere classified: Secondary | ICD-10-CM

## 2022-11-16 MED ORDER — GADOPICLENOL 0.5 MMOL/ML IV SOLN
8.0000 mL | Freq: Once | INTRAVENOUS | Status: AC | PRN
  Administered 2022-11-16: 8 mL via INTRAVENOUS

## 2022-11-30 ENCOUNTER — Ambulatory Visit: Payer: Managed Care, Other (non HMO) | Admitting: Thoracic Surgery (Cardiothoracic Vascular Surgery)

## 2022-11-30 ENCOUNTER — Encounter: Payer: Self-pay | Admitting: Thoracic Surgery (Cardiothoracic Vascular Surgery)

## 2022-11-30 VITALS — BP 153/80 | HR 82 | Resp 18 | Ht 69.0 in | Wt 188.0 lb

## 2022-11-30 DIAGNOSIS — J9859 Other diseases of mediastinum, not elsewhere classified: Secondary | ICD-10-CM

## 2022-11-30 NOTE — Progress Notes (Signed)
[address]       Matthew Curry [address]             [phone]     HPI: [name] returns for follow-up regarding anterior mediastinal soft tissue density.  Matthew Curry is a 32 year old man with a history of OCD, anxiety, narcotic use, reflux, and an anterior mediastinal soft tissue density.  Initially presented with a lump under the pectoralis muscle on his left anterior chest.  An ultrasound was done to evaluate that but was not definitive.  A CT of the chest was done which showed no abnormality in that area but did show a prominent soft tissue density in the anterior mediastinum.  I saw him in August 2023.  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  A follow-up CT in December was essentially unchanged.  An MR of the chest with IV contrast was recommended.  He recently had that done and now returns for follow-up.  He still feels this small lump just below the pectoralis muscle in the anterior chest wall.  Mobile and has not changed in size.  Nontender.  No weakness or double vision.  Patient Active Problem List   Diagnosis Date Noted   Right shoulder pain 05/12/2022   Acute nonintractable headache 05/12/2022   Bronchitis 01/18/2022   Subcutaneous nodule 07/27/2021   Fever 05/20/2021   Elbow clicking 02/18/2021   Lightheadedness 01/12/2021   Encounter to establish care with new doctor 01/12/2021   Tarsal tunnel syndrome 08/16/2017   Focal axillary diaphoresis 05/13/2017   RUQ pain 05/13/2017   Elevated blood pressure reading 05/27/2016   Low back pain 04/26/2016   Routine general medical examination at a health care facility 12/20/2014   Generalized anxiety disorder 03/11/2014    No current outpatient medications on file.   No current facility-administered medications for this visit.    Physical Exam BP (!) 153/80 (BP Location: Left Arm, Patient Position: Sitting)   Pulse 82   Resp 18   Ht 5\' 9"  (1.753 m)   Wt 188 lb (85.3 kg)   SpO2 98%  Comment: RA  BMI 27.1 kg/m  32 year old man in no acute distress Alert and oriented x 3 with no focal deficits Lungs clear with equal breath sounds bilaterally Cardiac regular rate and rhythm Palpable nodule, likely subcutaneous, just below the pectoralis muscle left anterior chest.  Nontender. No cervical or supraclavicular adenopathy  Diagnostic Tests: MR CHEST WITH AND WITHOUT CONTRAST   TECHNIQUE: Multiplanar, multisequence MR imaging of the chest was performed before and after the administration of intravenous contrast.   CONTRAST:  8 cc Vueway   COMPARISON:  Chest CT 02/09/2022 and 08/21/2021.   FINDINGS: Cardiovascular: There are no significant vascular findings. The heart size is normal. There is no pericardial effusion.   Mediastinum/Nodes: Although not optimally visualized by this examination due to breathing artifact, the previously demonstrated anterior mediastinal mass appears slightly smaller and remains most consistent with thymic hyperplasia based on relative stability over 15 months. Inferior component measures approximately 1.8 x 1.0 cm on image 54/12. A superior component measuring up to 1.5 cm on image 34/12 is not significantly changed. There is no associated restricted diffusion. Mild nonspecific enhancement following contrast. No other enlarged mediastinal, hilar or axillary lymph nodes are identified. The thyroid gland, trachea and esophagus demonstrate no significant findings.   Lungs/Pleura: No significant pleural effusion. No gross pulmonary abnormalities identified by MRI.   Upper abdomen:  The visualized upper abdomen appears  unremarkable.   Musculoskeletal/Chest wall: There is no chest wall mass or suspicious osseous finding.   Unless specific follow-up recommendations are mentioned in the findings or impression sections, no imaging follow-up of any mentioned incidental findings is recommended.   IMPRESSION: 1. The previously  demonstrated anterior mediastinal mass appears slightly smaller and remains most consistent with thymic hyperplasia based on appearance and relative stability over 15 months. If additional follow-up is warranted clinically, CT in 6-12 months suggested. 2. No acute findings.     Electronically Signed   By: Carey Bullocks M.D.   On: 11/30/2022 12:48 I personally reviewed the MR images.  Persistent soft tissue and anterior mediastinum.  Possibly slightly smaller compared to previous CT.  Imaging characteristics consistent with thymic hyperplasia.  Unable to see any abnormality in soft tissue or muscle to correlate with nodule on exam.  Impression: Matthew Curry is a 32 year old man with a history of OCD, anxiety, narcotic use, reflux, and an anterior mediastinal soft tissue density.    Anterior mediastinal soft tissue density-slightly smaller and with imaging characteristics consistent with thymic hyperplasia.  I think just to be on the safe side we should repeat another scan in about a year.  Chest wall nodule-with palpable on exam today there is about a 6 mm nodule just below the pectoralis muscle.  Feel subcutaneous.  He will monitor, and if there is any change would recommend excision.  Plan: Return in 1 year with CT chest  Loreli Slot, MD [facility] [phone]

## 2023-01-14 NOTE — Telephone Encounter (Signed)
Error

## 2023-02-06 IMAGING — US US SOFT TISSUE
1 series · 14 of 16 positions shown · non-contrast
Comparison: None Available.

CLINICAL DATA: Subcutaneous nodule left chest wall.

EXAM:
ULTRASOUND OF CHEST SOFT TISSUES
TECHNIQUE: Ultrasound examination of the chest wall soft tissues was performed
in the area of clinical concern.

[Series 1: us soft tissue · 0.05mm/px · 24 acquisitions, 14 frames shown]
[im 1/24]
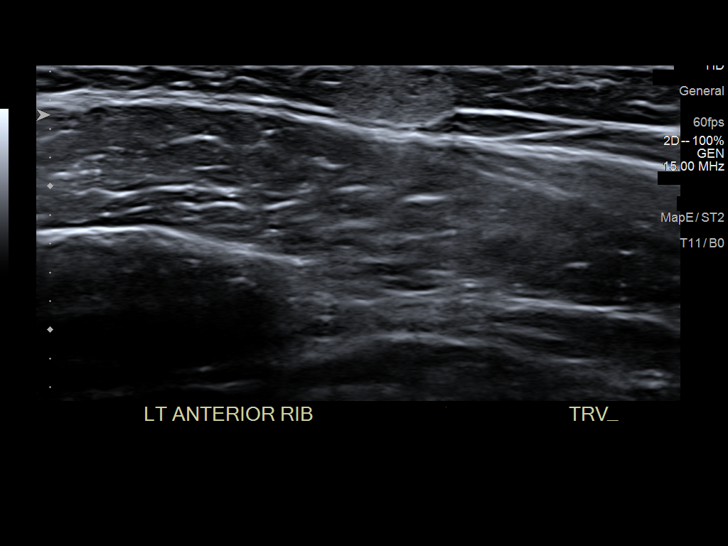
[im 2/24]
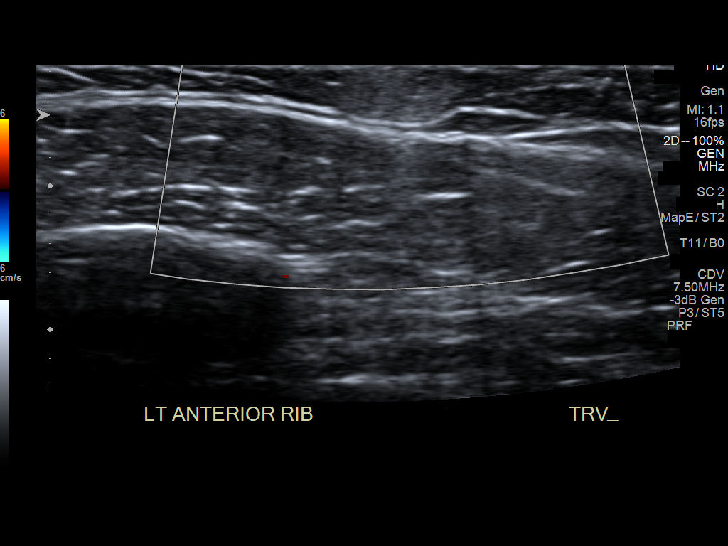
[im 4/24]
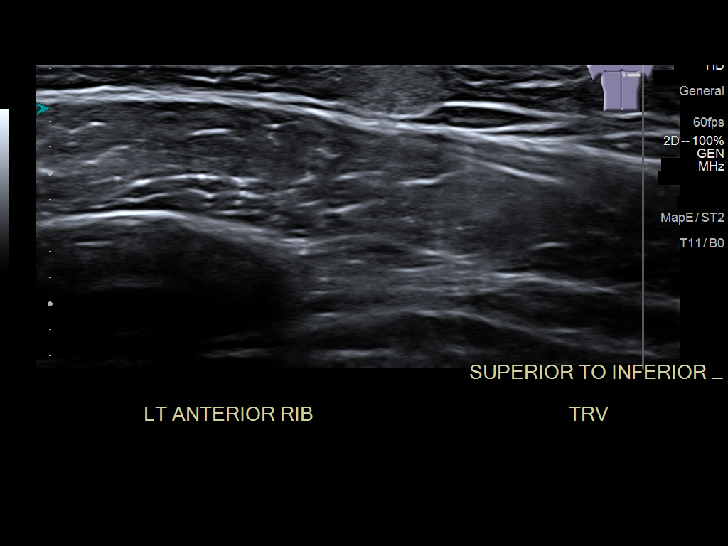
[im 7/24]
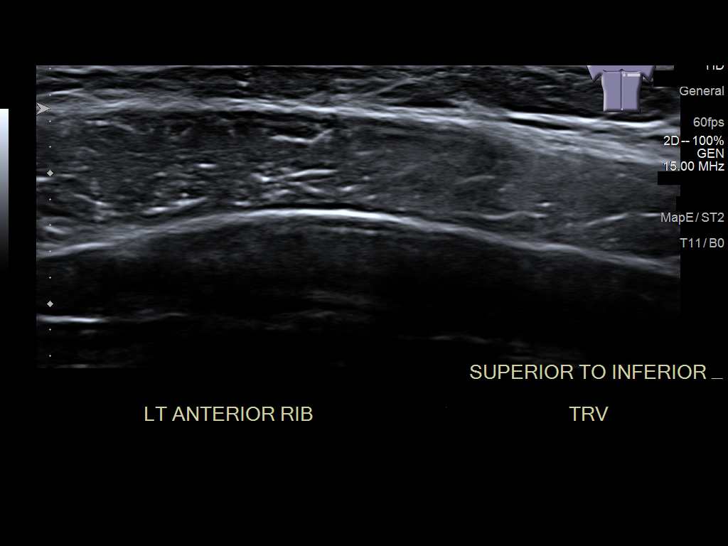
[im 8/24]
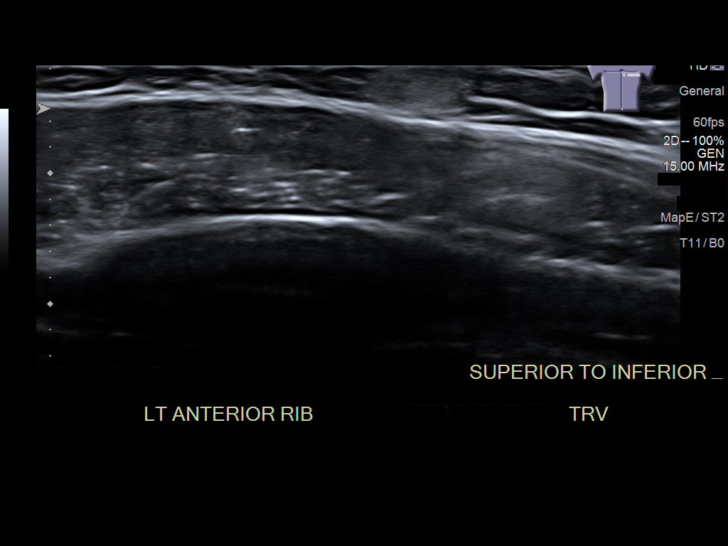
[im 10/24]
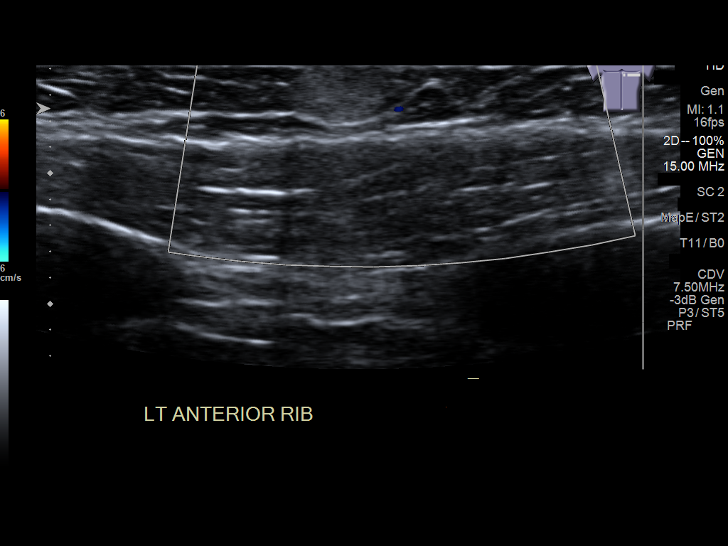
[im 11/24]
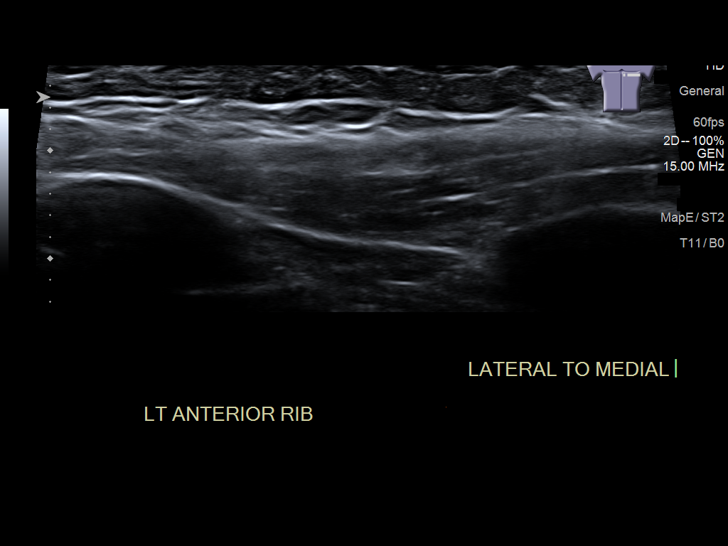
[im 13/24]
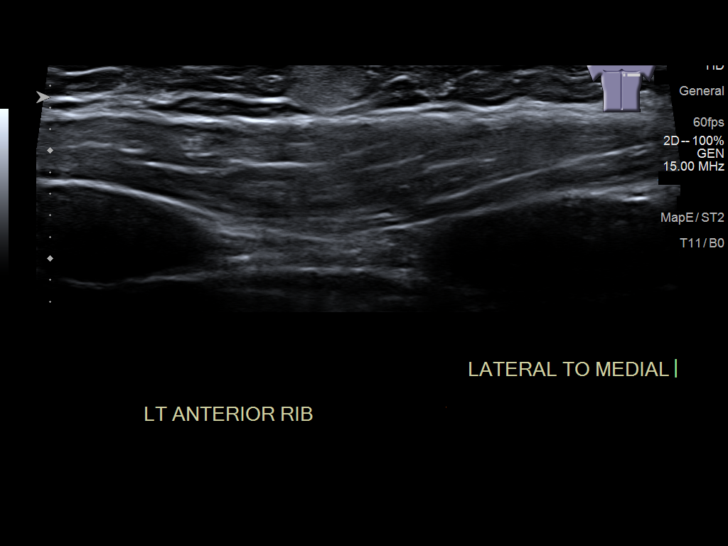
[im 14/24]
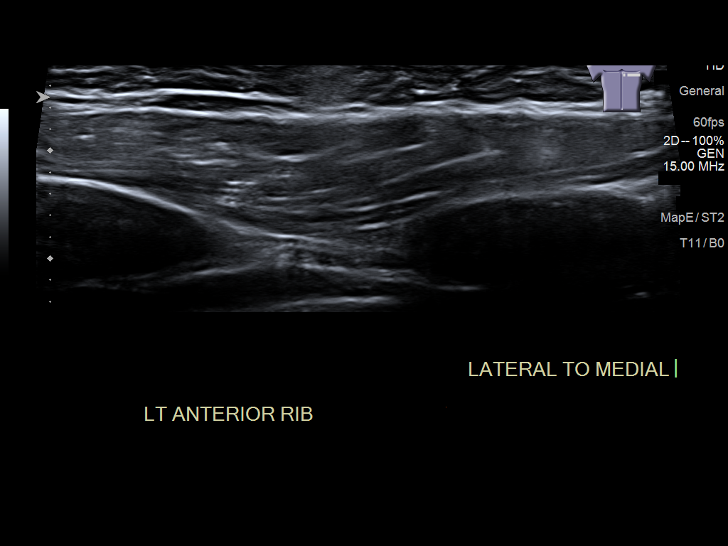
[im 16/24]
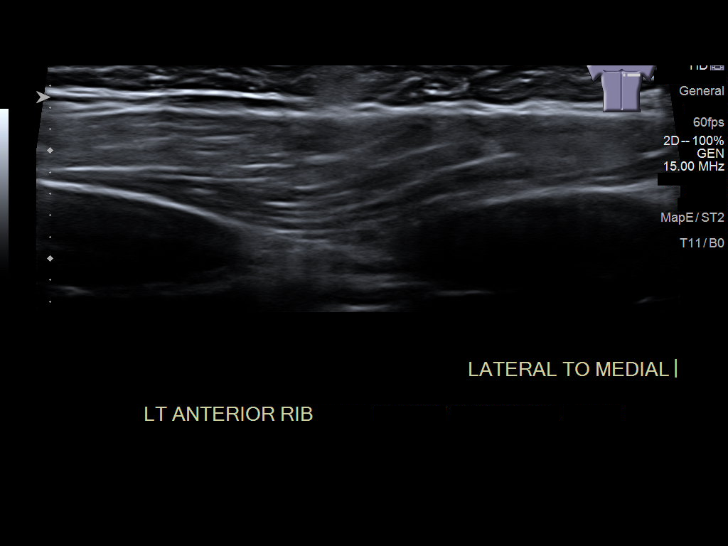
[im 19/24]
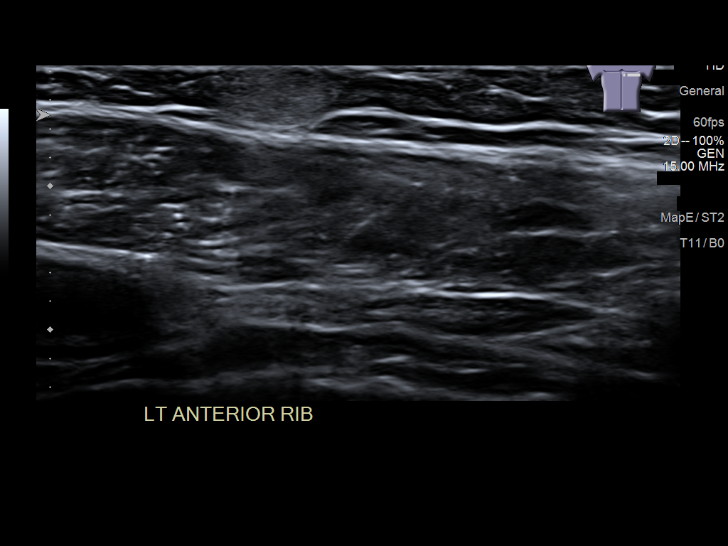
[im 20/24]
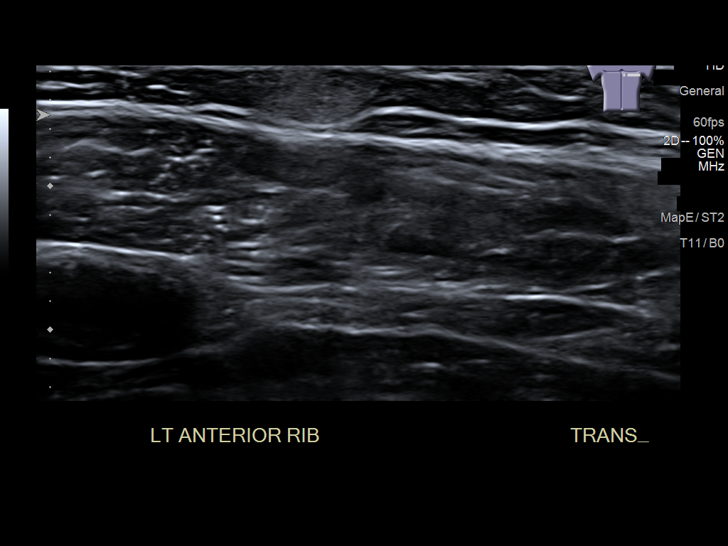
[im 22/24]
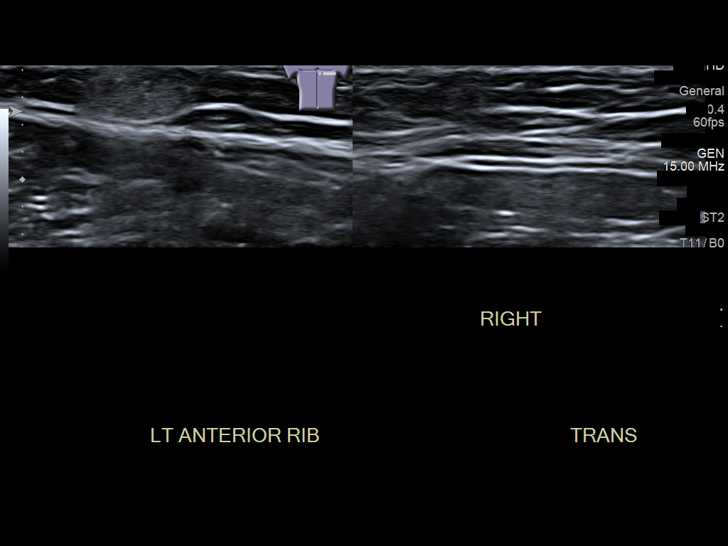
[im 24/24]
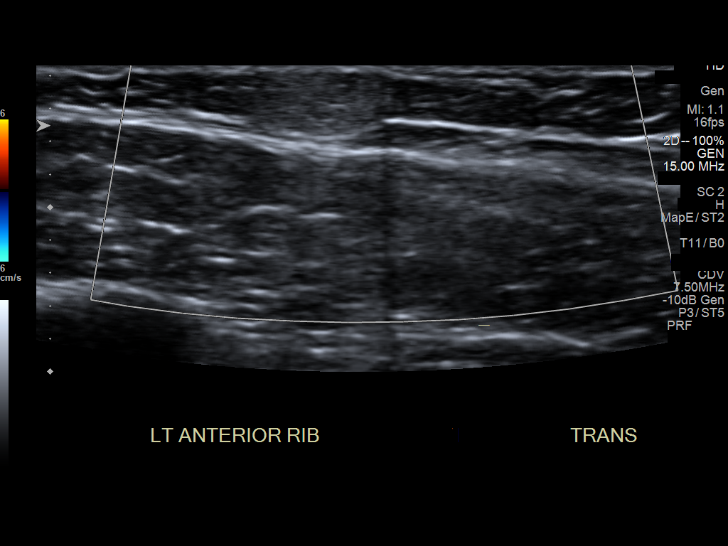

[14 of 16 positions shown; findings below may reference images not displayed]

FINDINGS: A slightly hyperechoic intramuscular lesion measures 8 x 7 x 4 mm.
Slight increased through transmission noted. Lesion is wider than it
is tall. No other lesions are present.
IMPRESSION: Slight increase in size of a slightly hyperechoic intramuscular
lesion in the left chest wall. The lesion is wider than it is tall.
The lesion is nonspecific. This could represent a small
intramuscular lipoma. A more aggressive lesion is not excluded. CT
of the chest with contrast or MRI of the area would be more
specific.

## 2023-02-21 IMAGING — CT CT CHEST W/ CM
2 of 4 series · 15 of 36 positions shown, 18 images · IV contrast (agent unspecified)
Comparison: Soft tissue chest ultrasound 08/06/2021

CLINICAL DATA: Soft tissue mass

EXAM:
CT CHEST WITH CONTRAST
TECHNIQUE: Multidetector CT imaging of the chest was performed during
intravenous contrast administration.

[Series 2: axial chest 2.00 · axial · 0.70mm/px · z∈[-1199,-895]mm · 12 of 180 slices shown, 15 images]
[im 14/180  mediastinal]
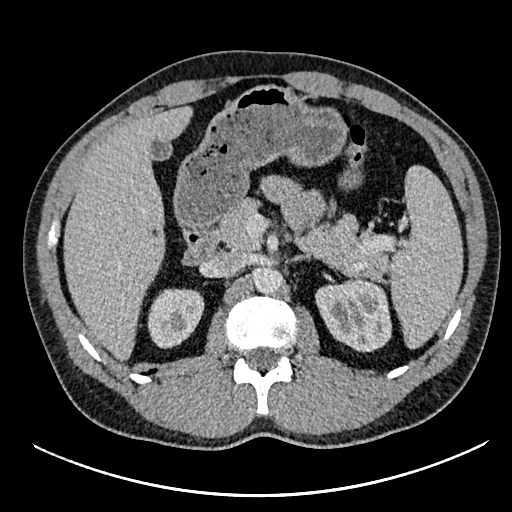
[im 14/180  lung]
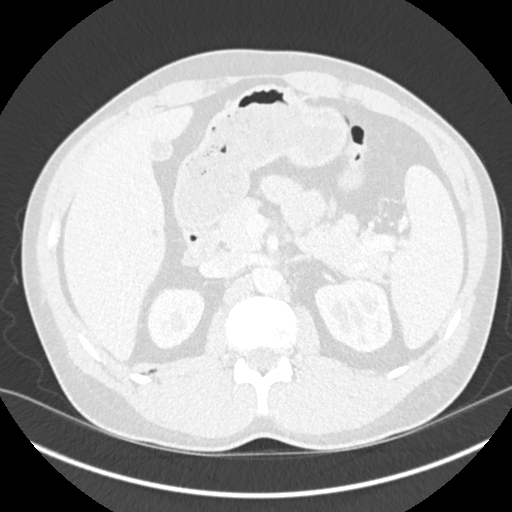
[im 28/180  lung]
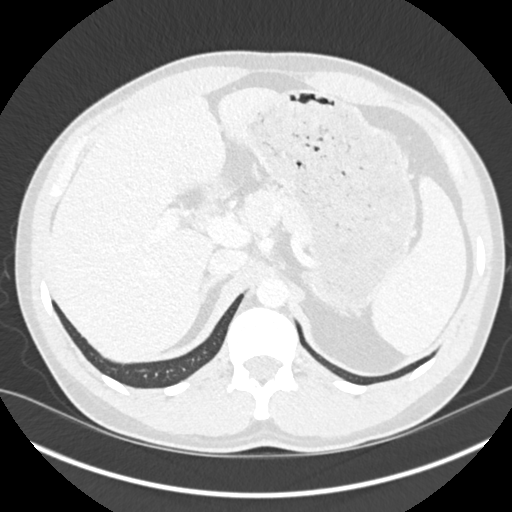
[im 42/180  lung]
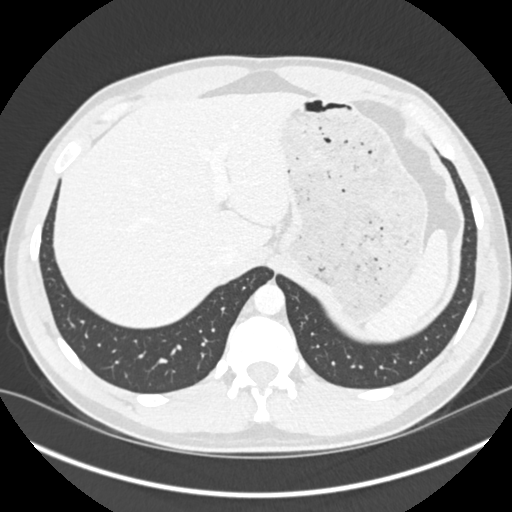
[im 56/180  lung]
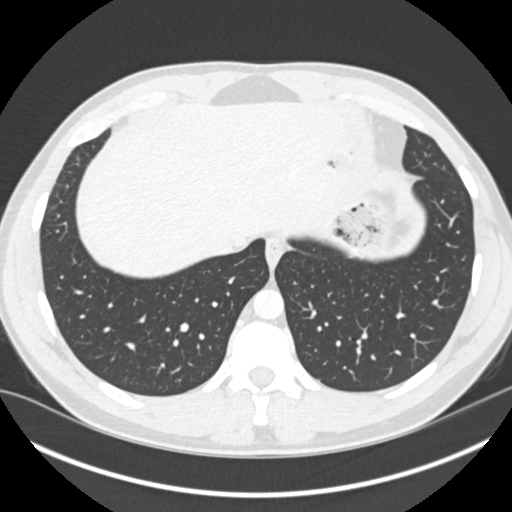
[im 69/180  mediastinal]
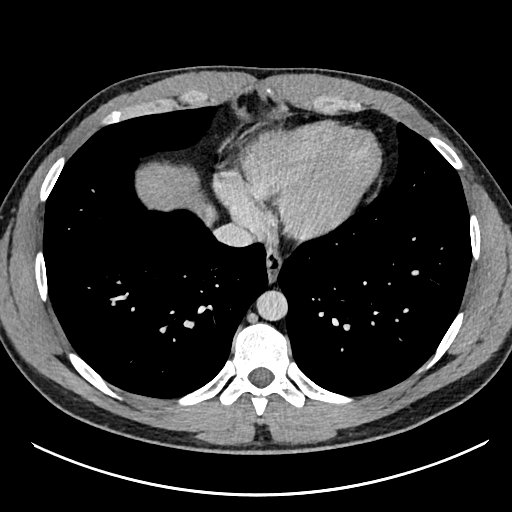
[im 69/180  lung]
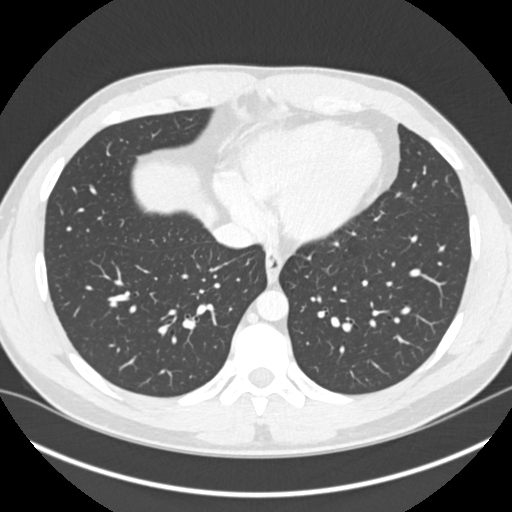
[im 83/180  lung]
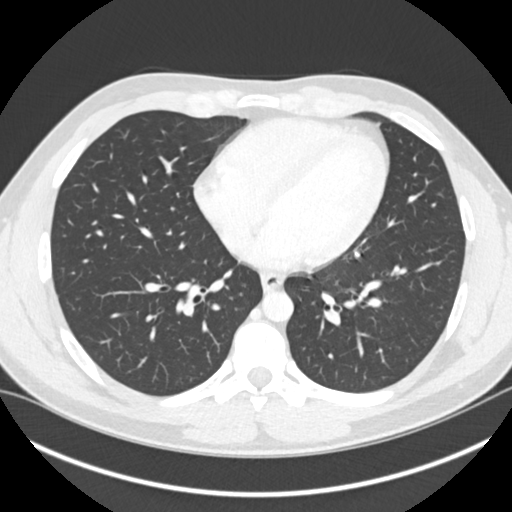
[im 97/180  lung]
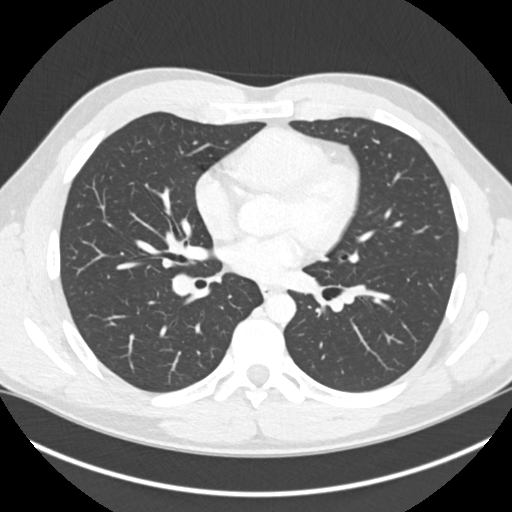
[im 111/180  lung]
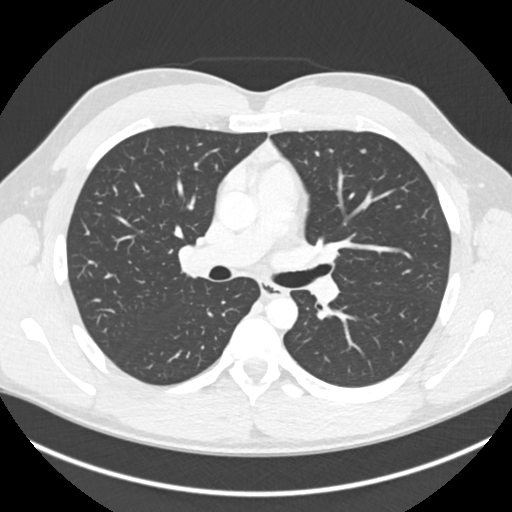
[im 124/180  mediastinal]
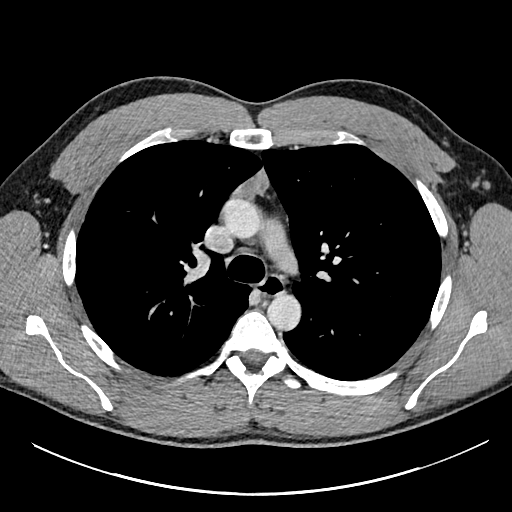
[im 124/180  lung]
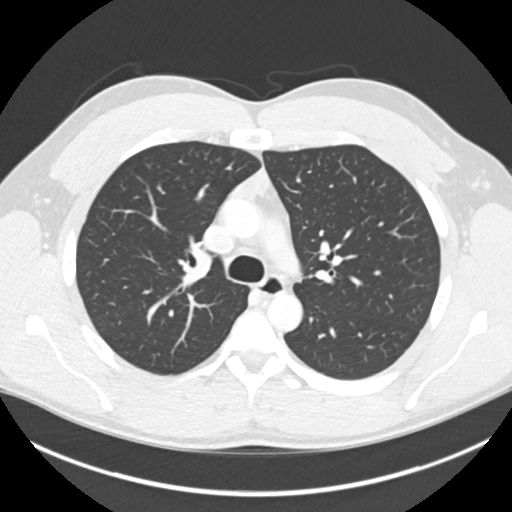
[im 138/180  lung]
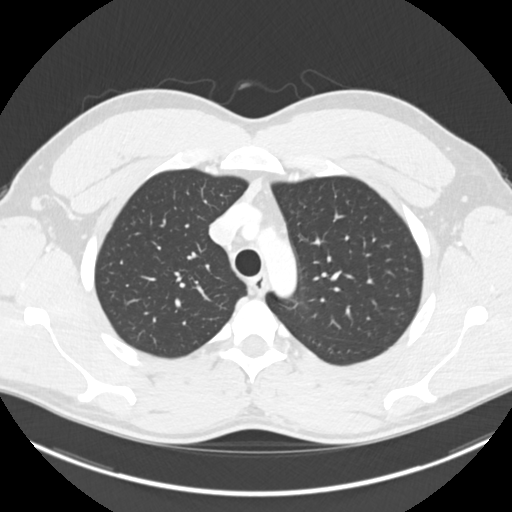
[im 152/180  lung]
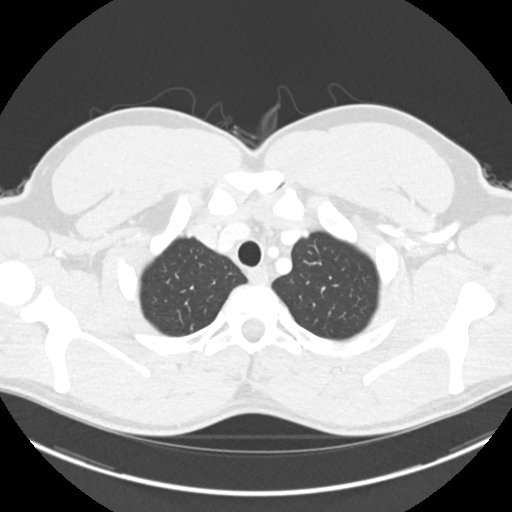
[im 166/180  lung]
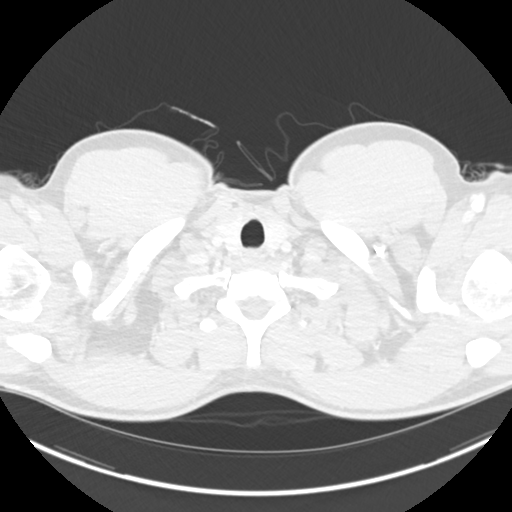

[Series 4: coronal chest 2.00 cor · coronal · 0.70mm/px · 3 of 179 slices shown]
[im 36/179  lung]
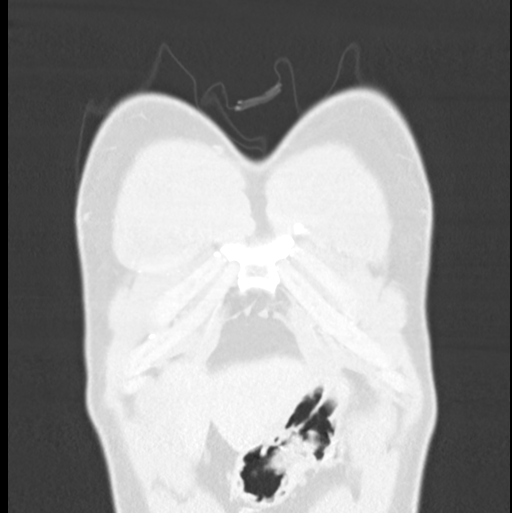
[im 72/179  lung]
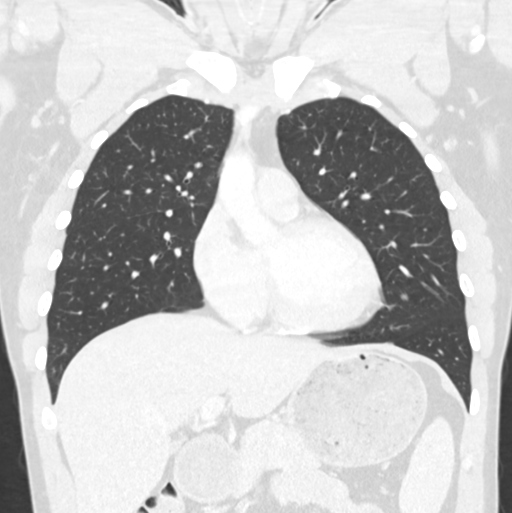
[im 107/179  lung]
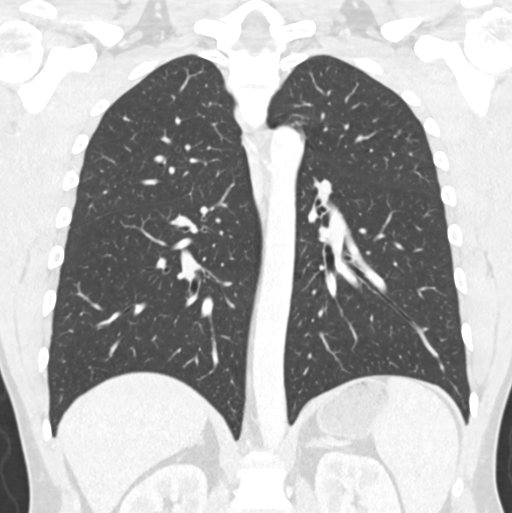

[15 of 36 positions shown; findings below may reference images not displayed]

RADIATION DOSE REDUCTION: This exam was performed according to the
departmental dose-optimization program which includes automated
exposure control, adjustment of the mA and/or kV according to
patient size and/or use of iterative reconstruction technique.

CONTRAST:  75mL OMNIPAQUE IOHEXOL 300 MG/ML  SOLN
FINDINGS: Cardiovascular: No significant vascular findings. Normal heart size.
No pericardial effusion.

Mediastinum/Nodes: Anterior mediastinal soft tissue which likely
represents thymus tissue. No bulky lymphadenopathy identified.
Thyroid gland appears normal.

Lungs/Pleura: No focal consolidation identified. A few pulmonary
nodules identified bilaterally measuring up to 5 mm perifissural on
the right series 3, image 82, 3 mm pleural-based right upper lobe
series 3, image 64, 3 mm in the left upper lobe series 3, image 70.
No pleural effusion or pneumothorax. No endobronchial lesions.

Upper Abdomen: No acute process identified. Spleen is upper normal
size.

Musculoskeletal: No chest wall abnormality. No acute or significant
osseous findings.
IMPRESSION: 1. No chest wall mass identified. The subcentimeter subcutaneous
likely lipoma seen on ultrasound is not definitely visualized.
2. Remnant thymus tissue noted which appears prominent for the
patient's age, correlate clinically.
3. Multiple small pulmonary nodules as described, measuring up to 5
mm perifissural on the right. Most likely postinfectious given the
patient's age. No routine follow-up required.

## 2023-03-15 ENCOUNTER — Encounter: Payer: Managed Care, Other (non HMO) | Admitting: Family Medicine

## 2023-10-18 ENCOUNTER — Other Ambulatory Visit: Payer: Self-pay | Admitting: Thoracic Surgery (Cardiothoracic Vascular Surgery)

## 2023-10-18 DIAGNOSIS — R229 Localized swelling, mass and lump, unspecified: Secondary | ICD-10-CM

## 2023-10-18 DIAGNOSIS — J9859 Other diseases of mediastinum, not elsewhere classified: Secondary | ICD-10-CM

## 2023-10-18 DIAGNOSIS — E329 Disease of thymus, unspecified: Secondary | ICD-10-CM

## 2023-11-29 ENCOUNTER — Ambulatory Visit (HOSPITAL_COMMUNITY)
Admission: RE | Admit: 2023-11-29 | Discharge: 2023-11-29 | Disposition: A | Discharge status: Home / Self Care | Source: Ambulatory Visit | Attending: Thoracic Surgery (Cardiothoracic Vascular Surgery) | Admitting: Thoracic Surgery (Cardiothoracic Vascular Surgery)

## 2023-11-29 DIAGNOSIS — J9859 Other diseases of mediastinum, not elsewhere classified: Secondary | ICD-10-CM | POA: Insufficient documentation

## 2023-11-29 DIAGNOSIS — E329 Disease of thymus, unspecified: Secondary | ICD-10-CM | POA: Diagnosis not present

## 2023-11-29 DIAGNOSIS — R229 Localized swelling, mass and lump, unspecified: Secondary | ICD-10-CM | POA: Insufficient documentation

## 2023-11-29 MED ORDER — IOHEXOL 350 MG/ML SOLN
75.0000 mL | Freq: Once | INTRAVENOUS | Status: AC | PRN
  Administered 2023-11-29: 75 mL via INTRAVENOUS

## 2023-12-06 ENCOUNTER — Ambulatory Visit: Admitting: Thoracic Surgery (Cardiothoracic Vascular Surgery)

## 2023-12-20 ENCOUNTER — Ambulatory Visit: Attending: Thoracic Surgery (Cardiothoracic Vascular Surgery)

## 2023-12-20 VITALS — BP 138/78 | HR 67 | Resp 18 | Ht 69.0 in | Wt 202.0 lb

## 2023-12-20 DIAGNOSIS — J9859 Other diseases of mediastinum, not elsewhere classified: Secondary | ICD-10-CM

## 2023-12-20 NOTE — Progress Notes (Signed)
 [address] [place] [address]             [phone]            Fleming Prill [id number] Nov 04, 1990   History of Present Illness:  Matthew Curry is a 33 year old man with medical history of GAD, OCD, and GERD who presents for continued follow up of anterior mediastinal soft tissue mass.  He has been followed by our clinic since 2023.  Mediastinal mass was found after he noticed having a lump in the left anterior chest below his pectoralis muscle.  He had a CT scan to further evaluate this which incidentally found the soft tissue mass in the anterior mediastinum.  He had and MR of the chest with contrast which showed the mass had characteristics of thymic hyperplasia.   He still has the small lump on his left anterior chest.  This area is freely movable and subcutaneous.  The lump has not grown in size and is non painful.  He also reports episodes of dysphagia when eating meat.  He reports having one episode of regurgitation. He finds that the dysphagia is mainly with dry foods.  He does have GERD and takes omeprazole daily.  He denies symptoms of hyper/hypo thyroidism.     No current outpatient medications on file prior to visit.   No current facility-administered medications on file prior to visit.     ROS: Review of Systems  Constitutional:  Negative for malaise/fatigue and weight loss.  Eyes:  Negative for blurred vision.  Respiratory:  Negative for cough and shortness of breath.   Cardiovascular:  Negative for chest pain and leg swelling.  Gastrointestinal:        Dysphagia      BP 138/78 (BP Location: Right Arm)   Pulse 67   Resp 18   Ht 5' 9 (1.753 m)   Wt 202 lb (91.6 kg)   SpO2 95%   BMI 29.83 kg/m   Physical Exam Constitutional:      Appearance: Normal appearance.  HENT:     Head: Normocephalic and atraumatic.  Cardiovascular:     Rate and Rhythm: Normal rate and regular rhythm.     Heart sounds: Normal heart  sounds, S1 normal and S2 normal.  Pulmonary:     Effort: Pulmonary effort is normal.     Breath sounds: Normal breath sounds.  Skin:    General: Skin is warm and dry.      Neurological:     General: No focal deficit present.     Mental Status: He is alert and oriented to person, place, and time.      Imaging:  CLINICAL DATA:  Chest wall nodule.   EXAM: CT CHEST WITH CONTRAST   TECHNIQUE: Multidetector CT imaging of the chest was performed during intravenous contrast administration.   RADIATION DOSE REDUCTION: This exam was performed according to the departmental dose-optimization program which includes automated exposure control, adjustment of the mA and/or kV according to patient size and/or use of iterative reconstruction technique.   CONTRAST:  75mL OMNIPAQUE  IOHEXOL  350 MG/ML SOLN   COMPARISON:  CT scan chest from 02/09/2022.   FINDINGS: Cardiovascular: Normal cardiac size. No pericardial effusion. No aortic aneurysm.   Mediastinum/Nodes: Visualized thyroid gland appears grossly unremarkable. 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 The esophagus is nondistended precluding optimal assessment. No axillary, mediastinal or hilar lymphadenopathy by size criteria.   Lungs/Pleura: The central tracheo-bronchial tree is patent. No mass or consolidation. No pleural effusion or pneumothorax. Redemonstration of multiple, sub 4 mm, solid noncalcified nodules throughout bilateral lungs (marked with electronic arrow sign on series 2,304), which are essentially unchanged since the prior study from June 2023 and favored benign in etiology. No new or suspicious lung nodule.   Upper Abdomen: Visualized upper abdominal viscera within normal limits.   Musculoskeletal: The visualized soft tissues of the chest wall are grossly  unremarkable. No suspicious osseous lesions.   IMPRESSION: 1. Stable appearance of triangular soft tissue area in the anterior superior mediastinum since the prior study dating back to June 2023. 2. No lung mass, consolidation, pleural effusion or pneumothorax. No new or suspicious lung nodule. 3. Otherwise essentially unremarkable exam, as described above.     Electronically Signed   By: Ree Molt M.D.   On: 11/30/2023 16:05    A/P: Mediastinal mass -We reviewed CT scan.  Mediastinal mass is stable in appearence and size. Consistent with thymic hyperplasia.  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  He should attempt smaller bites and chew food thoroughly.  He should continue with omeprazole for acid reflux -If symptoms do not improve he should be seen by GI for possible further imaging  Chest Wall Nodule -Consistent in size and no change since 2023.  Discussed that most likely the area is a cyst or lipoma.  If area is bothersome then he could follow up with dermatology or PCP for excision.  Alarm symptoms discussed and he if noticed erythema, increased size, pain on palpation, or purulent drainage then he would need to be evaluated    Manuelita CHRISTELLA Rough, PA-C 12/20/23

## 2023-12-20 NOTE — Patient Instructions (Signed)
-  Follow up in one year with CT of chest

## 2024-02-23 ENCOUNTER — Telehealth: Payer: Self-pay | Admitting: *Deleted

## 2024-02-23 NOTE — Telephone Encounter (Signed)
 Spoke with pt and advised that if he feels that he needs more than OTC meds that he needs to be evaluated at [facility]. I also encouraged him to schedule an appt with our office when he feels better because he currently does not have a provider and needs a TOC appt.  Sending to doc of the day as an FYI

## 2024-02-23 NOTE — Telephone Encounter (Signed)
 Pt has not been seen since 06/2022. Has never scheduled a TOC.

## 2024-02-24 NOTE — Telephone Encounter (Signed)
 Spoke to pt he stated that he is feeling better and did schedule an appt for TOC on 03/19/24

## 2024-03-02 ENCOUNTER — Ambulatory Visit: Payer: Self-pay

## 2024-03-02 NOTE — Telephone Encounter (Signed)
" ° °  FYI Only or Action Required?: FYI only for provider: UC.  Patient was last seen in primary care on 05/07/2022 by Vincente Saber, NP.  Called Nurse Triage reporting Dizziness.  Symptoms began several days ago.  Interventions attempted: OTC medications: Zyrtec.  Symptoms are: unchanged.  Triage Disposition: See Physician Within 24 Hours  Patient/caregiver understands and will follow disposition?: Yes   Copied from CRM #[id number]. Topic: Clinical - Red Word Triage >> Mar 02, 2024  8:05 AM Robinson H wrote: [facility] that prompted transfer to Nurse Triage: Dizziness and lightheaded about 4-5 days can't get it to go away, elevated blood pressure 162/86, 142/83 got it down to 129/79 by laying down and relaxing Reason for Disposition  [1] MODERATE dizziness (e.g., interferes with normal activities) AND [2] has NOT been evaluated by doctor (or NP/PA) for this  (Exception: Dizziness caused by heat exposure, sudden standing, or poor fluid intake.)  Answer Assessment - Initial Assessment Questions 1. DESCRIPTION: Describe your dizziness.      Blood rushing from head  2. LIGHTHEADED: Do you feel lightheaded? (e.g., somewhat faint, woozy, weak upon standing)      Yes, lightheadedness  3. VERTIGO: Do you feel like either you or the room is spinning or tilting? (i.e., vertigo)      No spinning  4. SEVERITY: How bad is it?  Do you feel like you are going to faint? Can you stand and walk?      Can still perform ADLs, but need to take breaks lying down  5. ONSET:  When did the dizziness begin?      Last week  6. AGGRAVATING FACTORS: Does anything make it worse? (e.g., standing, change in head position)   Walking/standing       7. HEART RATE: Can you tell me your heart rate? How many beats in 15 seconds?  (Note: Not all patients can do this.)        95-99  8. CAUSE: What do you think is causing the dizziness? (e.g., decreased fluids or food, diarrhea, emotional  distress, heat exposure, new medicine, sudden standing, vomiting; unknown)      Had the flu last week or elevated blood pressure  9. RECURRENT SYMPTOM: Have you had dizziness before? If Yes, ask: When was the last time? What happened that time?      Not current  10. OTHER SYMPTOMS: Pt denies fever, chest pain, vomiting, bleeding         Reports diarrhea BP readings: 141/83, 146/82, then 129/78 This morning 162/86. Then 141/82  Pt denies dry mouth or decreased urination  Pt reports lightheadedness Pt is taking OTC Zyrtec Pt advised to go to UC for further evaluation and treatment due to lack of availability in clinic until Jan Pt agrees with plan of care, will call back for any worsening symptoms  Protocols used: Dizziness - Lightheadedness-A-AH  "

## 2024-03-02 NOTE — Telephone Encounter (Signed)
 Pt has not been seen in over a year and currently does not have a PCP. See previous triage note as well. Pt would not go to UC or ED last week.

## 2024-03-19 ENCOUNTER — Ambulatory Visit: Admitting: Family
